# Patient Record
Sex: Female | Born: 1985 | Race: White | Hispanic: No | Marital: Married | Smoking: Never smoker
Health system: Southern US, Community
[De-identification: ages and names within clinical notes are randomized; demographics above are authoritative.]

## PROBLEM LIST (undated history)

## (undated) ENCOUNTER — Inpatient Hospital Stay (HOSPITAL_COMMUNITY): Payer: Self-pay

## (undated) DIAGNOSIS — E282 Polycystic ovarian syndrome: Secondary | ICD-10-CM

## (undated) DIAGNOSIS — R768 Other specified abnormal immunological findings in serum: Secondary | ICD-10-CM

## (undated) DIAGNOSIS — IMO0002 Reserved for concepts with insufficient information to code with codable children: Secondary | ICD-10-CM

## (undated) DIAGNOSIS — R87619 Unspecified abnormal cytological findings in specimens from cervix uteri: Secondary | ICD-10-CM

## (undated) DIAGNOSIS — Z8619 Personal history of other infectious and parasitic diseases: Secondary | ICD-10-CM

## (undated) DIAGNOSIS — D802 Selective deficiency of immunoglobulin A [IgA]: Secondary | ICD-10-CM

## (undated) DIAGNOSIS — R112 Nausea with vomiting, unspecified: Secondary | ICD-10-CM

## (undated) DIAGNOSIS — Z9889 Other specified postprocedural states: Secondary | ICD-10-CM

## (undated) HISTORY — DX: Other specified abnormal immunological findings in serum: R76.8

## (undated) HISTORY — PX: OTHER SURGICAL HISTORY: SHX169

## (undated) HISTORY — PX: MYRINGOTOMY: SUR874

## (undated) HISTORY — DX: Personal history of other infectious and parasitic diseases: Z86.19

## (undated) HISTORY — PX: NO PAST SURGERIES: SHX2092

## (undated) HISTORY — PX: ABDOMINAL HYSTERECTOMY: SHX81

## (undated) HISTORY — PX: ADENOIDECTOMY: SUR15

---
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
## 2005-08-11 ENCOUNTER — Other Ambulatory Visit: Admission: RE | Admit: 2005-08-11 | Discharge: 2005-08-11 | Payer: Self-pay | Admitting: Obstetrics and Gynecology

## 2006-07-15 ENCOUNTER — Encounter: Admission: RE | Admit: 2006-07-15 | Discharge: 2006-07-15 | Payer: Self-pay | Admitting: Gastroenterology

## 2008-07-10 ENCOUNTER — Ambulatory Visit (HOSPITAL_COMMUNITY): Admission: RE | Admit: 2008-07-10 | Discharge: 2008-07-10 | Payer: Self-pay | Admitting: Obstetrics and Gynecology

## 2008-10-04 ENCOUNTER — Inpatient Hospital Stay (HOSPITAL_COMMUNITY): Admission: AD | Admit: 2008-10-04 | Discharge: 2008-10-07 | Payer: Self-pay | Admitting: Obstetrics and Gynecology

## 2010-05-17 ENCOUNTER — Encounter: Payer: Self-pay | Admitting: Gastroenterology

## 2010-06-02 ENCOUNTER — Other Ambulatory Visit: Payer: Self-pay | Admitting: Advanced Practice Midwife

## 2010-06-02 DIAGNOSIS — IMO0002 Reserved for concepts with insufficient information to code with codable children: Secondary | ICD-10-CM

## 2010-06-04 ENCOUNTER — Ambulatory Visit
Admission: RE | Admit: 2010-06-04 | Discharge: 2010-06-04 | Disposition: A | Discharge status: Home / Self Care | Payer: Federal, State, Local not specified - PPO | Source: Ambulatory Visit | Attending: Advanced Practice Midwife | Admitting: Advanced Practice Midwife

## 2010-06-04 DIAGNOSIS — IMO0002 Reserved for concepts with insufficient information to code with codable children: Secondary | ICD-10-CM

## 2010-08-03 LAB — RPR: RPR Ser Ql: NONREACTIVE

## 2010-08-03 LAB — CBC
HCT: 26.2 % — ABNORMAL LOW (ref 36.0–46.0)
Hemoglobin: 12.3 g/dL (ref 12.0–15.0)
Hemoglobin: 9.3 g/dL — ABNORMAL LOW (ref 12.0–15.0)
MCHC: 35.8 g/dL (ref 30.0–36.0)
MCV: 91.7 fL (ref 78.0–100.0)
MCV: 92.3 fL (ref 78.0–100.0)
Platelets: 116 10*3/uL — ABNORMAL LOW (ref 150–400)
RBC: 2.84 MIL/uL — ABNORMAL LOW (ref 3.87–5.11)
RBC: 2.97 MIL/uL — ABNORMAL LOW (ref 3.87–5.11)
RDW: 13.9 % (ref 11.5–15.5)
WBC: 11.1 10*3/uL — ABNORMAL HIGH (ref 4.0–10.5)
WBC: 12.7 10*3/uL — ABNORMAL HIGH (ref 4.0–10.5)

## 2012-02-15 LAB — OB RESULTS CONSOLE ABO/RH: RH Type: POSITIVE

## 2012-02-15 LAB — OB RESULTS CONSOLE GC/CHLAMYDIA: Chlamydia: NEGATIVE

## 2012-02-15 LAB — OB RESULTS CONSOLE RPR: RPR: NONREACTIVE

## 2012-02-15 LAB — OB RESULTS CONSOLE HEPATITIS B SURFACE ANTIGEN: Hepatitis B Surface Ag: NEGATIVE

## 2012-02-15 LAB — OB RESULTS CONSOLE HIV ANTIBODY (ROUTINE TESTING): HIV: NONREACTIVE

## 2012-02-18 DIAGNOSIS — O209 Hemorrhage in early pregnancy, unspecified: Secondary | ICD-10-CM

## 2012-02-25 ENCOUNTER — Inpatient Hospital Stay (HOSPITAL_COMMUNITY)
Admission: AD | Admit: 2012-02-25 | Discharge: 2012-02-25 | Disposition: A | Discharge status: Home / Self Care | Payer: Federal, State, Local not specified - PPO | Source: Ambulatory Visit | Attending: Obstetrics and Gynecology | Admitting: Obstetrics and Gynecology

## 2012-02-25 ENCOUNTER — Encounter (HOSPITAL_COMMUNITY): Payer: Self-pay

## 2012-02-25 ENCOUNTER — Inpatient Hospital Stay (HOSPITAL_COMMUNITY): Payer: Federal, State, Local not specified - PPO

## 2012-02-25 DIAGNOSIS — O209 Hemorrhage in early pregnancy, unspecified: Secondary | ICD-10-CM

## 2012-02-25 HISTORY — DX: Reserved for concepts with insufficient information to code with codable children: IMO0002

## 2012-02-25 HISTORY — DX: Unspecified abnormal cytological findings in specimens from cervix uteri: R87.619

## 2012-02-25 LAB — URINE MICROSCOPIC-ADD ON

## 2012-02-25 LAB — URINALYSIS, ROUTINE W REFLEX MICROSCOPIC
Bilirubin Urine: NEGATIVE
Glucose, UA: NEGATIVE mg/dL
Ketones, ur: 15 mg/dL — AB
Nitrite: NEGATIVE
Specific Gravity, Urine: 1.025 (ref 1.005–1.030)
pH: 6 (ref 5.0–8.0)

## 2012-02-25 NOTE — MAU Note (Signed)
Pt states started spotting around lunchtime. Has had IUP confirmed at [facility] at 7wks. Denies abnormal vaginal discharge. Has had cramping intermittently since beginning of pregnancy.

## 2012-02-25 NOTE — MAU Provider Note (Signed)
  History     CSN: [id number]  Arrival date and time: 02/25/12 1402   First Provider Initiated Contact with Patient 02/25/12 1615      Chief Complaint  Patient presents with  . Vaginal Bleeding   HPI   Pt is [redacted]w[redacted]d pregnant G2P1001 and presents with spotting at lunchtime when she went to the bathroom along with some cramping.  She is not having any spotting or cramping at this time.  She denies pain with urination, or diarrhea. She has had constipation.  Pt had a confirmed IUP at 7 weeks at [facility].  The office is closed this afternoon and was advised to come to MAU for evaluation. She last had intercourse 1 week ago.  Past Medical History  Diagnosis Date  . Abnormal Pap smear     Past Surgical History  Procedure Date  . No past surgeries     Family History  Problem Relation Age of Onset  . Other Neg Hx     History  Substance Use Topics  . Smoking status: Never Smoker   . Smokeless tobacco: Never Used  . Alcohol Use: No    Allergies: No Known Allergies  Prescriptions prior to admission  Medication Sig Dispense Refill  . Aspirin-Acetaminophen-Caffeine (EXCEDRIN PO) Take 1 tablet by mouth daily as needed. Pain      . ondansetron (ZOFRAN-ODT) 8 MG disintegrating tablet Take 8 mg by mouth every 8 (eight) hours as needed. nausea      . Prenatal Vit-Fe Fumarate-FA (PRENATAL MULTIVITAMIN) TABS Take 1 tablet by mouth at bedtime.        Review of Systems  Constitutional: Negative for fever and chills.  Gastrointestinal: Positive for abdominal pain and constipation. Negative for diarrhea.  Genitourinary: Negative for dysuria and urgency.   Physical Exam   Blood pressure 113/76, pulse 108, temperature 98.7 F (37.1 C), temperature source Oral, resp. rate 16, height 5\' 4"  (1.626 m), weight 105.235 kg (232 lb).  Physical Exam  Vitals reviewed. Constitutional: She is oriented to person, place, and time. She appears well-developed and well-nourished.  HENT:    Head: Normocephalic.  Eyes: Pupils are equal, round, and reactive to light.  Neck: Normal range of motion. Neck supple.  Cardiovascular: Normal rate.   Respiratory: Effort normal.  GI: Soft. She exhibits no distension. There is no tenderness. There is no rebound and no guarding.  Genitourinary:       Cervix NTclosed; no bleeding noted; small amount of creamy white discharge in vault; uterus gravid nontender  Musculoskeletal: Normal range of motion.  Neurological: She is alert and oriented to person, place, and time.  Skin: Skin is warm and dry.  Psychiatric: She has a normal mood and affect.    MAU Course  Procedures  Dr. Henderson Cloud aware Urinalysis pending- will contact pt if needs treatment No results found for this or any previous visit (from the past 24 hour(s)). Assessment and Plan  Bleeding in pregnancy- unknown reason Viable IUP [redacted]w[redacted]d by LMP;  [redacted]w[redacted]d by CRL D/c home on pelvic rest F/u with OB appointment- f/u sooner if sx recur Naomy Esham 02/25/2012, 4:19 PM

## 2012-02-26 LAB — URINE CULTURE

## 2012-04-26 NOTE — L&D Delivery Note (Signed)
Delivery Note  SVD viable female Apgars 8,9 over 2nd degree ML lac.  Placenta delivered spontaneously intact with 3VC. Repair with 2-0 Chromic with good support and hemostasis noted and R/V exam confirms.  PH art was sent.  [place] cord blood was not done.  Mother and baby were doing well.  EBL 350cc  Candice Camp, MD

## 2012-09-13 ENCOUNTER — Encounter (HOSPITAL_COMMUNITY): Payer: Self-pay | Admitting: *Deleted

## 2012-09-13 ENCOUNTER — Inpatient Hospital Stay (HOSPITAL_COMMUNITY)
Admission: AD | Admit: 2012-09-13 | Discharge: 2012-09-13 | Disposition: A | Discharge status: Home / Self Care | Payer: Federal, State, Local not specified - PPO | Source: Ambulatory Visit | Attending: Obstetrics and Gynecology | Admitting: Obstetrics and Gynecology

## 2012-09-13 ENCOUNTER — Telehealth (HOSPITAL_COMMUNITY): Payer: Self-pay | Admitting: *Deleted

## 2012-09-13 DIAGNOSIS — O26859 Spotting complicating pregnancy, unspecified trimester: Secondary | ICD-10-CM | POA: Insufficient documentation

## 2012-09-13 DIAGNOSIS — R197 Diarrhea, unspecified: Secondary | ICD-10-CM | POA: Insufficient documentation

## 2012-09-13 DIAGNOSIS — I2510 Atherosclerotic heart disease of native coronary artery without angina pectoris: Secondary | ICD-10-CM | POA: Insufficient documentation

## 2012-09-13 LAB — CBC WITH DIFFERENTIAL/PLATELET
Eosinophils Absolute: 0 10*3/uL (ref 0.0–0.7)
Eosinophils Relative: 0 % (ref 0–5)
HCT: 33.6 % — ABNORMAL LOW (ref 36.0–46.0)
Lymphocytes Relative: 13 % (ref 12–46)
Lymphs Abs: 2 10*3/uL (ref 0.7–4.0)
MCH: 30.1 pg (ref 26.0–34.0)
MCV: 88 fL (ref 78.0–100.0)
Monocytes Absolute: 1.1 10*3/uL — ABNORMAL HIGH (ref 0.1–1.0)
Platelets: 158 10*3/uL (ref 150–400)
RBC: 3.82 MIL/uL — ABNORMAL LOW (ref 3.87–5.11)
RDW: 14.6 % (ref 11.5–15.5)
WBC: 15.9 10*3/uL — ABNORMAL HIGH (ref 4.0–10.5)

## 2012-09-13 NOTE — Progress Notes (Signed)
Pt admits to nausea this morning and diarrhea this morning

## 2012-09-13 NOTE — Telephone Encounter (Signed)
Preadmission screen  

## 2012-09-13 NOTE — Progress Notes (Signed)
Dr Marcelle Overlie sent pt over for evaluation of ROM. Pt admits to some leakage of fluid and some spotting yesterday

## 2012-09-13 NOTE — MAU Note (Signed)
Pt was seen in the doctors office and FHR was rapid. Pt sent over for evaluation of fetal heart rate

## 2012-09-13 NOTE — MAU Provider Note (Signed)
Kelsey Gonzales is 27 y.o. G2P1001 [redacted]w[redacted]d weeks presenting with orders from the office for CBC and amniosure.  She was seen by Dr. Marcelle Overlie.  Had ultrasound to measure fluid in the office,  told the baby's heart rate was fast on that exam.   Had membranes stripped yesterday and saw spotting then.  None today.  + fetal movement.  She questions if her membranes are leaking.  Fern in office was negative.  Amniosure has been collected.  Cervical dilatation yesterday in the office 3cm.  She is comfortable at this time.  Result of amniosure will be reported to Dr. Marcelle Overlie by Massie Bougie, RN

## 2012-09-14 ENCOUNTER — Encounter (HOSPITAL_COMMUNITY): Payer: Self-pay | Admitting: *Deleted

## 2012-09-14 ENCOUNTER — Inpatient Hospital Stay (HOSPITAL_COMMUNITY): Payer: Federal, State, Local not specified - PPO | Admitting: Anesthesiology

## 2012-09-14 ENCOUNTER — Encounter (HOSPITAL_COMMUNITY): Payer: Self-pay | Admitting: Anesthesiology

## 2012-09-14 ENCOUNTER — Inpatient Hospital Stay (HOSPITAL_COMMUNITY)
Admission: AD | Admit: 2012-09-14 | Discharge: 2012-09-15 | DRG: 373 | Disposition: A | Discharge status: Home / Self Care | Payer: Federal, State, Local not specified - PPO | Source: Ambulatory Visit | Attending: Obstetrics and Gynecology | Admitting: Obstetrics and Gynecology

## 2012-09-14 LAB — CBC
Hemoglobin: 11.8 g/dL — ABNORMAL LOW (ref 12.0–15.0)
MCH: 30.2 pg (ref 26.0–34.0)
MCV: 87.2 fL (ref 78.0–100.0)
Platelets: 147 10*3/uL — ABNORMAL LOW (ref 150–400)
RBC: 3.91 MIL/uL (ref 3.87–5.11)
WBC: 20.2 10*3/uL — ABNORMAL HIGH (ref 4.0–10.5)

## 2012-09-14 MED ORDER — WITCH HAZEL-GLYCERIN EX PADS: 1.0000 "application " | MEDICATED_PAD | CUTANEOUS | Status: DC | PRN

## 2012-09-14 MED ORDER — PRENATAL MULTIVITAMIN CH
1.0000 | ORAL_TABLET | Freq: Every day | ORAL | Status: DC
  Filled 2012-09-14: qty 1

## 2012-09-14 MED ORDER — SENNOSIDES-DOCUSATE SODIUM 8.6-50 MG PO TABS
2.0000 | ORAL_TABLET | Freq: Every day | ORAL | Status: DC
  Administered 2012-09-14: 2 via ORAL

## 2012-09-14 MED ORDER — BENZOCAINE-MENTHOL 20-0.5 % EX AERO
1.0000 "application " | INHALATION_SPRAY | CUTANEOUS | Status: DC | PRN
  Filled 2012-09-14: qty 56

## 2012-09-14 MED ORDER — OXYTOCIN 40 UNITS IN LACTATED RINGERS INFUSION - SIMPLE MED
62.5000 mL/h | INTRAVENOUS | Status: DC
  Filled 2012-09-14: qty 1000

## 2012-09-14 MED ORDER — DIPHENHYDRAMINE HCL 25 MG PO CAPS: 25.0000 mg | ORAL_CAPSULE | Freq: Four times a day (QID) | ORAL | Status: DC | PRN

## 2012-09-14 MED ORDER — IBUPROFEN 600 MG PO TABS
600.0000 mg | ORAL_TABLET | Freq: Four times a day (QID) | ORAL | Status: DC
Start: 2012-09-14 — End: 2012-09-15
  Administered 2012-09-14 – 2012-09-15 (×4): 600 mg via ORAL
  Filled 2012-09-14 (×4): qty 1

## 2012-09-14 MED ORDER — FENTANYL 2.5 MCG/ML BUPIVACAINE 1/10 % EPIDURAL INFUSION (WH - ANES)
14.0000 mL/h | INTRAMUSCULAR | Status: DC | PRN
  Administered 2012-09-14: 14 mL/h via EPIDURAL
  Filled 2012-09-14: qty 125

## 2012-09-14 MED ORDER — ZOLPIDEM TARTRATE 5 MG PO TABS: 5.0000 mg | ORAL_TABLET | Freq: Every evening | ORAL | Status: DC | PRN

## 2012-09-14 MED ORDER — DIPHENHYDRAMINE HCL 50 MG/ML IJ SOLN: 12.5000 mg | INTRAMUSCULAR | Status: DC | PRN

## 2012-09-14 MED ORDER — MEASLES, MUMPS & RUBELLA VAC ~~LOC~~ INJ
0.5000 mL | INJECTION | Freq: Once | SUBCUTANEOUS | Status: DC
  Filled 2012-09-14: qty 0.5

## 2012-09-14 MED ORDER — IBUPROFEN 600 MG PO TABS: 600.0000 mg | ORAL_TABLET | Freq: Four times a day (QID) | ORAL | Status: DC | PRN

## 2012-09-14 MED ORDER — LIDOCAINE HCL (PF) 1 % IJ SOLN
30.0000 mL | INTRAMUSCULAR | Status: DC | PRN
  Filled 2012-09-14 (×2): qty 30

## 2012-09-14 MED ORDER — ONDANSETRON HCL 4 MG/2ML IJ SOLN
4.0000 mg | Freq: Four times a day (QID) | INTRAMUSCULAR | Status: DC | PRN
  Administered 2012-09-14: 4 mg via INTRAVENOUS
  Filled 2012-09-14: qty 2

## 2012-09-14 MED ORDER — MEDROXYPROGESTERONE ACETATE 150 MG/ML IM SUSP: 150.0000 mg | INTRAMUSCULAR | Status: DC | PRN

## 2012-09-14 MED ORDER — OXYCODONE-ACETAMINOPHEN 5-325 MG PO TABS
1.0000 | ORAL_TABLET | ORAL | Status: DC | PRN
  Administered 2012-09-14: 1 via ORAL
  Filled 2012-09-14: qty 1

## 2012-09-14 MED ORDER — LANOLIN HYDROUS EX OINT: TOPICAL_OINTMENT | CUTANEOUS | Status: DC | PRN

## 2012-09-14 MED ORDER — ONDANSETRON HCL 4 MG PO TABS: 4.0000 mg | ORAL_TABLET | ORAL | Status: DC | PRN

## 2012-09-14 MED ORDER — CITRIC ACID-SODIUM CITRATE 334-500 MG/5ML PO SOLN: 30.0000 mL | ORAL | Status: DC | PRN

## 2012-09-14 MED ORDER — PHENYLEPHRINE 40 MCG/ML (10ML) SYRINGE FOR IV PUSH (FOR BLOOD PRESSURE SUPPORT)
80.0000 ug | PREFILLED_SYRINGE | INTRAVENOUS | Status: DC | PRN
  Filled 2012-09-14: qty 2

## 2012-09-14 MED ORDER — PHENYLEPHRINE 40 MCG/ML (10ML) SYRINGE FOR IV PUSH (FOR BLOOD PRESSURE SUPPORT)
80.0000 ug | PREFILLED_SYRINGE | INTRAVENOUS | Status: DC | PRN
  Filled 2012-09-14: qty 5
  Filled 2012-09-14: qty 2

## 2012-09-14 MED ORDER — TETANUS-DIPHTH-ACELL PERTUSSIS 5-2.5-18.5 LF-MCG/0.5 IM SUSP: 0.5000 mL | Freq: Once | INTRAMUSCULAR | Status: DC

## 2012-09-14 MED ORDER — PRENATAL MULTIVITAMIN CH: 1.0000 | ORAL_TABLET | Freq: Every day | ORAL | Status: DC

## 2012-09-14 MED ORDER — SIMETHICONE 80 MG PO CHEW: 80.0000 mg | CHEWABLE_TABLET | ORAL | Status: DC | PRN

## 2012-09-14 MED ORDER — DIBUCAINE 1 % RE OINT
1.0000 "application " | TOPICAL_OINTMENT | RECTAL | Status: DC | PRN
  Filled 2012-09-14: qty 28

## 2012-09-14 MED ORDER — OXYTOCIN BOLUS FROM INFUSION
500.0000 mL | INTRAVENOUS | Status: DC
  Administered 2012-09-14: 500 mL via INTRAVENOUS

## 2012-09-14 MED ORDER — OXYCODONE-ACETAMINOPHEN 5-325 MG PO TABS
1.0000 | ORAL_TABLET | ORAL | Status: DC | PRN
Start: 2012-09-14 — End: 2012-09-14

## 2012-09-14 MED ORDER — ACETAMINOPHEN 325 MG PO TABS: 650.0000 mg | ORAL_TABLET | ORAL | Status: DC | PRN

## 2012-09-14 MED ORDER — LACTATED RINGERS IV SOLN
500.0000 mL | Freq: Once | INTRAVENOUS | Status: AC
  Administered 2012-09-14: 500 mL via INTRAVENOUS

## 2012-09-14 MED ORDER — FLEET ENEMA 7-19 GM/118ML RE ENEM: 1.0000 | ENEMA | RECTAL | Status: DC | PRN

## 2012-09-14 MED ORDER — SODIUM BICARBONATE 8.4 % IV SOLN
INTRAVENOUS | Status: DC | PRN
  Administered 2012-09-14: 5 mL via EPIDURAL

## 2012-09-14 MED ORDER — EPHEDRINE 5 MG/ML INJ
10.0000 mg | INTRAVENOUS | Status: DC | PRN
  Filled 2012-09-14: qty 2
  Filled 2012-09-14: qty 4

## 2012-09-14 MED ORDER — LACTATED RINGERS IV SOLN: 500.0000 mL | INTRAVENOUS | Status: DC | PRN

## 2012-09-14 MED ORDER — EPHEDRINE 5 MG/ML INJ
10.0000 mg | INTRAVENOUS | Status: DC | PRN
  Filled 2012-09-14: qty 2

## 2012-09-14 MED ORDER — LACTATED RINGERS IV SOLN: INTRAVENOUS | Status: DC

## 2012-09-14 MED ORDER — ONDANSETRON HCL 4 MG/2ML IJ SOLN: 4.0000 mg | INTRAMUSCULAR | Status: DC | PRN

## 2012-09-14 NOTE — H&P (Signed)
Kelsey Gonzales is a 27 y.o. female presenting for srom and now in labor.  Pregnancy uncomplicated. History OB History   Grav Para Term Preterm Abortions TAB SAB Ect Mult Living   2 1 1       1      Past Medical History  Diagnosis Date  . Abnormal Pap smear   . Hx of varicella    Past Surgical History  Procedure Laterality Date  . No past surgeries    . Myringotomy     Family History: family history includes Cancer in her maternal grandmother and paternal grandfather; Depression in her father, maternal grandfather, maternal grandmother, and paternal grandmother; Heart disease in her maternal grandfather, maternal grandmother, paternal grandfather, and paternal grandmother; Hypertension in her father, maternal grandfather, maternal grandmother, paternal grandfather, and paternal grandmother; Kidney Stones in her father; Migraines in her father; and Muscular dystrophy in her father and paternal grandmother.  There is no history of Other. Social History:  reports that she has never smoked. She has never used smokeless tobacco. She reports that she does not drink alcohol or use illicit drugs.   Prenatal Transfer Tool  Maternal Diabetes: No Genetic Screening: Normal Maternal Ultrasounds/Referrals: Normal Fetal Ultrasounds or other Referrals:  None Maternal Substance Abuse:  No Significant Maternal Medications:  None Significant Maternal Lab Results:  None Other Comments:     ROS  Dilation: Lip/rim Effacement (%): 100 Station: 0 Exam by:: Luan Moore, RN Blood pressure 85/57, pulse 119, temperature 98.9 F (37.2 C), temperature source Oral, resp. rate 20, height 5\' 5"  (1.651 m), weight 114.023 kg (251 lb 6 oz), SpO2 100.00%. Exam Physical Exam  Prenatal labs: ABO, Rh: A/Positive/-- (10/22 0000) Antibody: Negative (10/22 0000) Rubella: Immune (10/22 0000) RPR: Nonreactive (10/22 0000)  HBsAg: Negative (10/22 0000)  HIV: Non-reactive (10/22 0000)  GBS: Negative (05/01 0000)    Assessment/Plan: IUP at term in active labor Anticipate SVD   Chrisandra Wiemers C 09/14/2012, 8:40 AM

## 2012-09-14 NOTE — MAU Note (Signed)
PT SAYS SHE WAS HERE YESTERDAY- VE 3 CM- UNSURE IF SROM- AMINSURE- NEG.    HAS BEEN HAVING UC ALL NIGHT - SINCE 0230- BAD.   STILL LEAKING SOMETHING.  DENIES HSV AND MRSA.

## 2012-09-14 NOTE — Anesthesia Postprocedure Evaluation (Signed)
Anesthesia Post Note  Patient: Kelsey Gonzales  Procedure(s) Performed: * No procedures listed *  Anesthesia type: Epidural  Patient location: Mother/Baby  Post pain: Pain level controlled  Post assessment: Post-op Vital signs reviewed  Last Vitals:  Filed Vitals:   09/14/12 1300  BP: 130/61  Pulse: 99  Temp: 37.1 C  Resp: 18    Post vital signs: Reviewed  Level of consciousness:alert  Complications: No apparent anesthesia complications

## 2012-09-14 NOTE — Anesthesia Preprocedure Evaluation (Signed)
Anesthesia Evaluation  Patient identified by MRN, date of birth, ID band Patient awake    Reviewed: Allergy & Precautions, H&P , Patient's Chart, lab work & pertinent test results  Airway Mallampati: II TM Distance: >3 FB Neck ROM: full    Dental no notable dental hx.    Pulmonary  breath sounds clear to auscultation  Pulmonary exam normal       Cardiovascular Exercise Tolerance: Good Rhythm:regular Rate:Normal     Neuro/Psych    GI/Hepatic   Endo/Other    Renal/GU      Musculoskeletal   Abdominal   Peds  Hematology   Anesthesia Other Findings   Reproductive/Obstetrics                           Anesthesia Physical Anesthesia Plan  ASA: III  Anesthesia Plan: Epidural   Post-op Pain Management:    Induction:   Airway Management Planned:   Additional Equipment:   Intra-op Plan:   Post-operative Plan:   Informed Consent: I have reviewed the patients History and Physical, chart, labs and discussed the procedure including the risks, benefits and alternatives for the proposed anesthesia with the patient or authorized representative who has indicated his/her understanding and acceptance.   Dental Advisory Given  Plan Discussed with:   Anesthesia Plan Comments: (Labs checked- platelets confirmed with RN in room. Fetal heart tracing, per RN, reported to be stable enough for sitting procedure. Discussed epidural, and patient consents to the procedure:  included risk of possible headache,backache, failed block, allergic reaction, and nerve injury. This patient was asked if she had any questions or concerns before the procedure started. )        Anesthesia Quick Evaluation

## 2012-09-14 NOTE — MAU Note (Signed)
PT IN B-ROOM 

## 2012-09-14 NOTE — Anesthesia Procedure Notes (Signed)

## 2012-09-15 LAB — CBC
HCT: 30.5 % — ABNORMAL LOW (ref 36.0–46.0)
Hemoglobin: 10.3 g/dL — ABNORMAL LOW (ref 12.0–15.0)
MCH: 30.4 pg (ref 26.0–34.0)
MCHC: 33.8 g/dL (ref 30.0–36.0)
MCV: 90 fL (ref 78.0–100.0)

## 2012-09-15 MED ORDER — IBUPROFEN 600 MG PO TABS: 600.0000 mg | ORAL_TABLET | Freq: Four times a day (QID) | ORAL | Status: DC

## 2012-09-15 NOTE — Discharge Summary (Signed)
Obstetric Discharge Summary Reason for Admission: onset of labor and rupture of membranes Prenatal Procedures: ultrasound Intrapartum Procedures: spontaneous vaginal delivery Postpartum Procedures: none Complications-Operative and Postpartum: 2 degree perineal laceration Hemoglobin  Date Value Range Status  09/15/2012 10.3* 12.0 - 15.0 g/dL Final     HCT  Date Value Range Status  09/15/2012 30.5* 36.0 - 46.0 % Final    Physical Exam:  General: alert and cooperative Lochia: appropriate Uterine Fundus: firm Incision: perineum intact DVT Evaluation: No evidence of DVT seen on physical exam. Negative Homan's sign. No cords or calf tenderness. No significant calf/ankle edema.  Discharge Diagnoses: Term Pregnancy-delivered  Discharge Information: Date: 09/15/2012 Activity: pelvic rest Diet: routine Medications: PNV and Ibuprofen Condition: stable Instructions: refer to practice specific booklet Discharge to: home   Newborn Data: Live born female  Birth Weight: 7 lb 12.2 oz (3521 g) APGAR: 8, 9  Home with mother.  Jagjit Riner G 09/15/2012, 8:22 AM

## 2012-09-15 NOTE — Progress Notes (Cosign Needed)
Post Partum Day 1 Subjective: no complaints, up ad lib, voiding and tolerating PO  Objective: Blood pressure 101/67, pulse 80, temperature 98.1 F (36.7 C), temperature source Oral, resp. rate 18, height 5\' 5"  (1.651 m), weight 251 lb 6 oz (114.023 kg), SpO2 100.00%, unknown if currently breastfeeding.  Physical Exam:  General: alert and cooperative Lochia: appropriate Uterine Fundus: firm Incision: perineum intact DVT Evaluation: No evidence of DVT seen on physical exam. Negative Homan's sign. No cords or calf tenderness. No significant calf/ankle edema.   Recent Labs  09/14/12 0730 09/15/12 0620  HGB 11.8* 10.3*  HCT 34.1* 30.5*    Assessment/Plan: Discharge home   LOS: 1 day   Guynell Kleiber G 09/15/2012, 8:16 AM

## 2012-09-20 ENCOUNTER — Inpatient Hospital Stay (HOSPITAL_COMMUNITY): Admission: RE | Admit: 2012-09-20 | Payer: Federal, State, Local not specified - PPO | Source: Ambulatory Visit

## 2012-09-21 ENCOUNTER — Emergency Department (HOSPITAL_COMMUNITY): Payer: Federal, State, Local not specified - PPO

## 2012-09-21 ENCOUNTER — Emergency Department (HOSPITAL_COMMUNITY)
Admission: EM | Admit: 2012-09-21 | Discharge: 2012-09-21 | Disposition: A | Discharge status: Home / Self Care | Payer: Federal, State, Local not specified - PPO | Attending: Emergency Medicine | Admitting: Emergency Medicine

## 2012-09-21 ENCOUNTER — Encounter (HOSPITAL_COMMUNITY): Payer: Self-pay | Admitting: Emergency Medicine

## 2012-09-21 DIAGNOSIS — X500XXA Overexertion from strenuous movement or load, initial encounter: Secondary | ICD-10-CM | POA: Insufficient documentation

## 2012-09-21 DIAGNOSIS — Y9389 Activity, other specified: Secondary | ICD-10-CM | POA: Insufficient documentation

## 2012-09-21 DIAGNOSIS — M25561 Pain in right knee: Secondary | ICD-10-CM

## 2012-09-21 DIAGNOSIS — IMO0002 Reserved for concepts with insufficient information to code with codable children: Secondary | ICD-10-CM | POA: Insufficient documentation

## 2012-09-21 DIAGNOSIS — Z8619 Personal history of other infectious and parasitic diseases: Secondary | ICD-10-CM | POA: Insufficient documentation

## 2012-09-21 DIAGNOSIS — Z79899 Other long term (current) drug therapy: Secondary | ICD-10-CM | POA: Insufficient documentation

## 2012-09-21 DIAGNOSIS — S99919A Unspecified injury of unspecified ankle, initial encounter: Secondary | ICD-10-CM | POA: Insufficient documentation

## 2012-09-21 DIAGNOSIS — Y9289 Other specified places as the place of occurrence of the external cause: Secondary | ICD-10-CM | POA: Insufficient documentation

## 2012-09-21 DIAGNOSIS — S8990XA Unspecified injury of unspecified lower leg, initial encounter: Secondary | ICD-10-CM | POA: Insufficient documentation

## 2012-09-21 MED ORDER — ACETAMINOPHEN 500 MG PO TABS
1000.0000 mg | ORAL_TABLET | Freq: Once | ORAL | Status: AC
  Administered 2012-09-21: 1000 mg via ORAL
  Filled 2012-09-21: qty 2

## 2012-09-21 MED ORDER — IBUPROFEN 400 MG PO TABS
600.0000 mg | ORAL_TABLET | Freq: Once | ORAL | Status: AC
  Administered 2012-09-21: 600 mg via ORAL
  Filled 2012-09-21: qty 1

## 2012-09-21 NOTE — ED Provider Notes (Signed)
Medical screening examination/treatment/procedure(s) were performed by non-physician practitioner and as supervising physician I was immediately available for consultation/collaboration.Gwyneth Sprout, MD 09/21/12 2029

## 2012-09-21 NOTE — Progress Notes (Signed)
Orthopedic Tech Progress Note Patient Details:  Kelsey Gonzales 08/20/1985 [id number]  Ortho Devices Type of Ortho Device: Crutches;Knee Immobilizer Ortho Device/Splint Location: RLE Ortho Device/Splint Interventions: Ordered;Application;Adjustment   Jennye Moccasin 09/21/2012, 7:42 PM

## 2012-09-21 NOTE — ED Notes (Signed)
Pt here with knee pain. Pt's knee gave way while ambulating in kitchen.

## 2012-09-21 NOTE — ED Provider Notes (Signed)
History     CSN: [id number]  Arrival date & time 09/21/12  1650   First MD Initiated Contact with Patient 09/21/12 1656      Chief Complaint  Patient presents with  . Knee Pain    (Consider location/radiation/quality/duration/timing/severity/associated sxs/prior treatment) HPI Comments: Patient is a 27 year old female who presents today after twisting her knee while holding her daughter a few hours ago. She is having a sharp pains and the sensation that her knee is locked. Straightening her knee makes the pain worse. No prior injury to this knee. No medications PTA. She has been icing her knee. She is currently breast feeding her daughter. No fevers, chills, abdominal pain.   Patient is a 27 y.o. female presenting with knee pain. The history is provided by the patient. No language interpreter was used.  Knee Pain Associated symptoms: no fever     Past Medical History  Diagnosis Date  . Abnormal Pap smear   . Hx of varicella     Past Surgical History  Procedure Laterality Date  . No past surgeries    . Myringotomy      Family History  Problem Relation Age of Onset  . Other Neg Hx   . Hypertension Father   . Kidney Stones Father   . Migraines Father   . Depression Father   . Muscular dystrophy Father   . Heart disease Maternal Grandmother   . Hypertension Maternal Grandmother   . Cancer Maternal Grandmother     kidney  . Depression Maternal Grandmother   . Heart disease Maternal Grandfather   . Hypertension Maternal Grandfather   . Depression Maternal Grandfather   . Heart disease Paternal Grandmother   . Hypertension Paternal Grandmother   . Depression Paternal Grandmother   . Muscular dystrophy Paternal Grandmother   . Heart disease Paternal Grandfather   . Hypertension Paternal Grandfather   . Cancer Paternal Grandfather     colon, leaukemia    History  Substance Use Topics  . Smoking status: Never Smoker   . Smokeless tobacco: Never Used  . Alcohol  Use: No    OB History   Grav Para Term Preterm Abortions TAB SAB Ect Mult Living   2 2 2       2       Review of Systems  Constitutional: Negative for fever and chills.  Respiratory: Negative for shortness of breath.   Cardiovascular: Negative for chest pain.  Gastrointestinal: Negative for nausea, vomiting and abdominal pain.  Musculoskeletal: Positive for joint swelling, arthralgias and gait problem.  All other systems reviewed and are negative.    Allergies  Review of patient's allergies indicates no known allergies.  Home Medications   Current Outpatient Rx  Name  Route  Sig  Dispense  Refill  . acetaminophen (TYLENOL) 500 MG tablet   Oral   Take 500 mg by mouth every 6 (six) hours as needed for pain (headache).         . hydrocortisone cream 1 %   Topical   Apply topically 2 (two) times daily.         Marland Kitchen ibuprofen (ADVIL,MOTRIN) 600 MG tablet   Oral   Take 1 tablet (600 mg total) by mouth every 6 (six) hours.   30 tablet   1   . Prenatal Vit-Fe Fumarate-FA (PRENATAL MULTIVITAMIN) TABS   Oral   Take 1 tablet by mouth at bedtime.           BP 114/70  Pulse 100  Resp 18  SpO2 100%  Physical Exam  Nursing note and vitals reviewed. Constitutional: She is oriented to person, place, and time. She appears well-developed and well-nourished. No distress.  HENT:  Head: Normocephalic and atraumatic.  Right Ear: External ear normal.  Left Ear: External ear normal.  Nose: Nose normal.  Mouth/Throat: Oropharynx is clear and moist.  Eyes: Conjunctivae are normal.  Neck: Normal range of motion.  Cardiovascular: Normal rate, regular rhythm and normal heart sounds.   Pulmonary/Chest: Effort normal and breath sounds normal. No stridor. No respiratory distress. She has no wheezes. She has no rales.  Abdominal: Soft. She exhibits no distension.  Musculoskeletal:       Right knee: She exhibits decreased range of motion and swelling. She exhibits no ecchymosis, no  deformity, no erythema, no LCL laxity, normal patellar mobility and no MCL laxity. Tenderness found. Medial joint line tenderness noted. No lateral joint line, no MCL, no LCL and no patellar tendon tenderness noted.  Neurological: She is alert and oriented to person, place, and time. She has normal strength.  Skin: Skin is warm and dry. She is not diaphoretic. No erythema.  Psychiatric: She has a normal mood and affect. Her behavior is normal.    ED Course  Procedures (including critical care time)  Labs Reviewed - No data to display Dg Knee Complete 4 Views Right  09/21/2012   *RADIOLOGY REPORT*  Clinical Data: Fall.  Knee injury and pain.  RIGHT KNEE - COMPLETE 4+ VIEW  Comparison:  None.  Findings:  There is no evidence of fracture, dislocation, or joint effusion.  There is no evidence of arthropathy or other focal bone abnormality.  Soft tissues are unremarkable.  IMPRESSION: Negative.   Original Report Authenticated By: Myles Rosenthal, M.D.    [phone]: Patient still waiting for xray. Tylenol did not help. Orders put in for ice pack and advil.   1. Knee pain, acute, right       MDM  Patient presents today after her knee gave out earlier today. XR negative for acute pathology. Given a knee immobilizer and crutches. Advil and tylenol for pain since she is breast feeding. Ice the area. Ortho referral given. Return instruction discussed. Vital signs stable for discharge. Patient / Family / Caregiver informed of clinical course, understand medical decision-making process, and agree with plan.        Mora Bellman, PA-C 09/21/12 [id number]

## 2013-03-01 ENCOUNTER — Other Ambulatory Visit: Payer: Self-pay

## 2013-05-30 ENCOUNTER — Encounter: Payer: Self-pay | Admitting: Gastroenterology

## 2013-07-02 ENCOUNTER — Ambulatory Visit: Payer: Federal, State, Local not specified - PPO | Admitting: Gastroenterology

## 2013-08-10 ENCOUNTER — Other Ambulatory Visit (INDEPENDENT_AMBULATORY_CARE_PROVIDER_SITE_OTHER): Payer: Federal, State, Local not specified - PPO

## 2013-08-10 ENCOUNTER — Ambulatory Visit (INDEPENDENT_AMBULATORY_CARE_PROVIDER_SITE_OTHER): Payer: Federal, State, Local not specified - PPO | Admitting: Gastroenterology

## 2013-08-10 ENCOUNTER — Encounter: Payer: Self-pay | Admitting: Gastroenterology

## 2013-08-10 VITALS — BP 122/80 | HR 72 | Ht 64.0 in | Wt 240.2 lb

## 2013-08-10 DIAGNOSIS — R197 Diarrhea, unspecified: Secondary | ICD-10-CM

## 2013-08-10 LAB — COMPREHENSIVE METABOLIC PANEL
ALK PHOS: 63 U/L (ref 39–117)
ALT: 19 U/L (ref 0–35)
AST: 16 U/L (ref 0–37)
Albumin: 4.1 g/dL (ref 3.5–5.2)
BILIRUBIN TOTAL: 0.6 mg/dL (ref 0.3–1.2)
BUN: 11 mg/dL (ref 6–23)
CO2: 26 mEq/L (ref 19–32)
Calcium: 9.6 mg/dL (ref 8.4–10.5)
Chloride: 103 mEq/L (ref 96–112)
Creatinine, Ser: 0.7 mg/dL (ref 0.4–1.2)
GFR: 109.31 mL/min (ref >60.00)
GLUCOSE: 102 mg/dL — AB (ref 70–99)
POTASSIUM: 4 meq/L (ref 3.5–5.1)
SODIUM: 137 meq/L (ref 135–145)
TOTAL PROTEIN: 7.7 g/dL (ref 6.0–8.3)

## 2013-08-10 LAB — CBC WITH DIFFERENTIAL/PLATELET
Basophils Absolute: 0.1 10*3/uL (ref 0.0–0.1)
Basophils Relative: 0.8 % (ref 0.0–3.0)
EOS ABS: 0.2 10*3/uL (ref 0.0–0.7)
EOS PCT: 1.7 % (ref 0.0–5.0)
HEMATOCRIT: 38.9 % (ref 36.0–46.0)
Hemoglobin: 13.2 g/dL (ref 12.0–15.0)
LYMPHS ABS: 2.4 10*3/uL (ref 0.7–4.0)
Lymphocytes Relative: 26 % (ref 12.0–46.0)
MCHC: 34 g/dL (ref 30.0–36.0)
MCV: 82.9 fl (ref 78.0–100.0)
MONO ABS: 0.6 10*3/uL (ref 0.1–1.0)
Monocytes Relative: 5.9 % (ref 3.0–12.0)
Neutro Abs: 6.1 10*3/uL (ref 1.4–7.7)
Neutrophils Relative %: 65.6 % (ref 43.0–77.0)
PLATELETS: 180 10*3/uL (ref 150.0–400.0)
RBC: 4.7 Mil/uL (ref 3.87–5.11)
RDW: 14.1 % (ref 11.5–14.6)
WBC: 9.4 10*3/uL (ref 4.5–10.5)

## 2013-08-10 MED ORDER — PEG-KCL-NACL-NASULF-NA ASC-C 100 G PO SOLR: 1.0000 | Freq: Once | ORAL | Status: DC

## 2013-08-10 MED ORDER — HYOSCYAMINE SULFATE ER 0.375 MG PO TB12: 0.3750 mg | ORAL_TABLET | Freq: Two times a day (BID) | ORAL | Status: DC

## 2013-08-10 NOTE — Patient Instructions (Addendum)
You will have labs checked today in the basement lab.  Please head down after you check out with the front desk  (celiac sprue panel, stool for routine culture, stool for giardia, cmet, cbc, tsh). Stop all caffeine products for now. You will be set up for a colonoscopy for diarrhea. Twice daily antispasm med called into [facility].

## 2013-08-10 NOTE — Progress Notes (Signed)
HPI: This is a   very pleasant 28 year old woman who is here with her 28-year-old daughter today.  Has diarrhea usually after certain foods (bread, pasta, dairy at times, coffee, choc chip cookies).  She cut out all those foods, she had no abd cramping, no diarhea).  Bread can be the worst for her.  She has seen rectal bleeding, rarely, with hemorrhoids and constipation.  Bloating is a problem for her.  Nausea.    Very rarely constipation.  Delivered baby 10 months ago.  Since then she is up 12 pounds.  She normally has watery diarrhea, for about a day.  She doesn't eat yogurt and only rarely.  Ice cream causes problem.  Drinks 1-2 sweet teas per day, Dr. Reino KentPepper 2-3 times per week.    Rare other sodas, no sports drinks.  Never tested for celiac sprue.  This has been going on for many years.  Takes 2-3 imodium per month.    Review of systems: Pertinent positive and negative review of systems were noted in the above HPI section. Complete review of systems was performed and was otherwise normal.    Past Medical History  Diagnosis Date  . Abnormal Pap smear   . Hx of varicella     Past Surgical History  Procedure Laterality Date  . No past surgeries    . Myringotomy      Current Outpatient Prescriptions  Medication Sig Dispense Refill  . ibuprofen (ADVIL,MOTRIN) 600 MG tablet Take 1 tablet (600 mg total) by mouth every 6 (six) hours.  30 tablet  1  . Prenatal Vit-Fe Fumarate-FA (PRENATAL MULTIVITAMIN) TABS Take 1 tablet by mouth at bedtime.       No current facility-administered medications for this visit.    Allergies as of 08/10/2013  . (No Known Allergies)    Family History  Problem Relation Age of Onset  . Other Neg Hx   . Hypertension Father   . Kidney Stones Father   . Migraines Father   . Depression Father   . Muscular dystrophy Father   . Heart disease Maternal Grandmother   . Hypertension Maternal Grandmother   . Cancer Maternal Grandmother    kidney  . Depression Maternal Grandmother   . Heart disease Maternal Grandfather   . Hypertension Maternal Grandfather   . Depression Maternal Grandfather   . Heart disease Paternal Grandmother   . Hypertension Paternal Grandmother   . Depression Paternal Grandmother   . Muscular dystrophy Paternal Grandmother   . Heart disease Paternal Grandfather   . Hypertension Paternal Grandfather   . Cancer Paternal Grandfather     colon, leaukemia    History   Social History  . Marital Status: Married    Spouse Name: N/A    Number of Children: N/A  . Years of Education: N/A   Occupational History  . Not on file.   Social History Main Topics  . Smoking status: Never Smoker   . Smokeless tobacco: Never Used  . Alcohol Use: No  . Drug Use: No  . Sexual Activity: Yes    Birth Control/ Protection: None   Other Topics Concern  . Not on file   Social History Narrative  . No narrative on file       Physical Exam: BP 122/80  Pulse 72  Ht 5\' 4"  (1.626 m)  Wt 240 lb 3.2 oz (108.954 kg)  BMI 41.21 kg/m2  Breastfeeding? No Constitutional: generally well-appearing Psychiatric: alert and oriented x3 Eyes: extraocular movements intact  Mouth: oral pharynx moist, no lesions Neck: supple no lymphadenopathy Cardiovascular: heart regular rate and rhythm Lungs: clear to auscultation bilaterally Abdomen: soft, nontender, nondistended, no obvious ascites, no peritoneal signs, normal bowel sounds Extremities: no lower extremity edema bilaterally Skin: no lesions on visible extremities    Assessment and plan: 28 y.o. 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 She has however noticed some blood in her stools and with her chronic symptoms oh electrolytes laboratory bowel disease with colonoscopy. She will also have labs today according CBC, complete metabolic profile, thyroid testing, celiac sprue  panel, stool tests for Giardia, ova parasites, routine stool cultures. Recommended she stop all caffeine products for now. I am also going to call her in twice daily and a spasm medicine which has 80 spasmodic property as well as some mild constipating side effects which could be nice for her.

## 2013-08-13 LAB — CELIAC PANEL 10
Endomysial Screen: NEGATIVE
GLIADIN IGG: 6.6 U/mL (ref <20)
IgA: 7 mg/dL — ABNORMAL LOW (ref 69–380)
Tissue Transglut Ab: 6.1 U/mL (ref <20)
Tissue Transglutaminase Ab, IgA: <1 U/mL (ref <20)

## 2013-08-13 LAB — TSH: TSH: 2.63 u[IU]/mL (ref 0.35–5.50)

## 2013-08-14 ENCOUNTER — Telehealth: Payer: Self-pay | Admitting: Gastroenterology

## 2013-08-14 LAB — GIARDIA ANTIGEN: GIARDIA SCREEN (EIA): NEGATIVE

## 2013-08-14 NOTE — Telephone Encounter (Signed)
Pt notified that results have not been reviewed and she will be called as soon as available

## 2013-08-17 LAB — STOOL CULTURE

## 2013-08-20 ENCOUNTER — Encounter: Payer: Self-pay | Admitting: Gastroenterology

## 2013-08-24 ENCOUNTER — Encounter: Payer: Federal, State, Local not specified - PPO | Admitting: Gastroenterology

## 2013-08-27 ENCOUNTER — Encounter: Payer: Self-pay | Admitting: Gastroenterology

## 2013-08-27 ENCOUNTER — Ambulatory Visit (AMBULATORY_SURGERY_CENTER): Payer: Federal, State, Local not specified - PPO | Admitting: Gastroenterology

## 2013-08-27 VITALS — BP 103/55 | HR 75 | Temp 96.3°F | Resp 20 | Ht 64.0 in | Wt 240.0 lb

## 2013-08-27 DIAGNOSIS — R142 Eructation: Secondary | ICD-10-CM

## 2013-08-27 DIAGNOSIS — R141 Gas pain: Secondary | ICD-10-CM

## 2013-08-27 DIAGNOSIS — R197 Diarrhea, unspecified: Secondary | ICD-10-CM

## 2013-08-27 DIAGNOSIS — R143 Flatulence: Secondary | ICD-10-CM

## 2013-08-27 DIAGNOSIS — R140 Abdominal distension (gaseous): Secondary | ICD-10-CM

## 2013-08-27 MED ORDER — SODIUM CHLORIDE 0.9 % IV SOLN
500.0000 mL | INTRAVENOUS | Status: DC
Start: 2013-08-27 — End: 2013-08-27

## 2013-08-27 NOTE — Progress Notes (Signed)
Called to room to assist during endoscopic procedure.  Patient ID and intended procedure confirmed with present staff. Received instructions for my participation in the procedure from the performing physician.  

## 2013-08-27 NOTE — Op Note (Signed)
[facility] [address] [place], [id number]   COLONOSCOPY PROCEDURE REPORT  PATIENT: Kelsey Gonzales, [name]  MR#: [id number] BIRTHDATE: 01/15/1986 , 28  yrs. old GENDER: Female ENDOSCOPIST: Rachael Feeaniel P Jacobs, MD REFERRED XB:JYNWGNFAOBY:Kimberlee Shaw, M.D. PROCEDURE DATE:  08/27/2013 PROCEDURE:   Colonoscopy with biopsy First Screening Colonoscopy - Avg.  risk and is 50 yrs.  old or older - No.  Prior Negative Screening - Now for repeat screening. N/A  History of Adenoma - Now for follow-up colonoscopy & has been > or = to 3 yrs.  N/A  Polyps Removed Today? No.  Recommend repeat exam, <10 yrs? No. ASA CLASS:   Class II INDICATIONS:chronic diarrhea. MEDICATIONS: MAC sedation, administered by CRNA and propofol (Diprivan) 400mg  IV  DESCRIPTION OF PROCEDURE:   After the risks benefits and alternatives of the procedure were thoroughly explained, informed consent was obtained.  A digital rectal exam revealed no abnormalities of the rectum.   The LB [id number]  endoscope was introduced through the anus and advanced to the terminal ileum which was intubated for a short distance. No adverse events experienced.   The quality of the prep was excellent.  The instrument was then slowly withdrawn as the colon was fully examined.   COLON FINDINGS: A normal appearing cecum, ileocecal valve, and appendiceal orifice were identified.  The ascending, hepatic flexure, transverse, splenic flexure, descending, sigmoid colon and rectum appeared unremarkable.  No polyps or cancers were seen. The colon was randomly biopsied.   The mucosa appeared normal in the terminal ileum.  Retroflexed views revealed no abnormalities. The time to cecum=6 minutes 23 seconds.  Withdrawal time=5 minutes 37 seconds.  The scope was withdrawn and the procedure completed. COMPLICATIONS: There were no complications.  ENDOSCOPIC IMPRESSION: 1.   Normal colon, biopsied to check for Microscopic Colitis. 2.   Normal  mucosa in the terminal ileum  RECOMMENDATIONS: Await biopsy results.  Please start the twice daily antispasmodic medicine that was prescribed at time of your office visit.   eSigned:  Rachael Feeaniel P Jacobs, MD 08/27/2013 1:23 PM

## 2013-08-27 NOTE — Patient Instructions (Signed)

## 2013-08-27 NOTE — Progress Notes (Signed)
Procedure ends, to recovcery, report given and VSS.

## 2013-08-27 NOTE — Op Note (Signed)
[facility] [address] [place], [id number]   ENDOSCOPY PROCEDURE REPORT  PATIENT: Kelsey Gonzales, [name]  MR#: [id number] BIRTHDATE: [birth year] , 28  yrs. 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 MEDICATIONS: MAC sedation, administered by CRNA and Propofol (Diprivan) 130 mg IV TOPICAL ANESTHETIC: none  DESCRIPTION OF PROCEDURE: After the risks benefits and alternatives of the procedure were thoroughly explained, informed consent was obtained.  The LB [id number] endoscope was introduced through the mouth and advanced to the second portion of the duodenum. Without limitations.  The instrument was slowly withdrawn as the mucosa was fully examined.     The upper, middle and distal third of the esophagus were carefully inspected and no abnormalities were noted.  The z-line was well seen at the GEJ.  The endoscope was pushed into the fundus which was normal including a retroflexed view.  The antrum, gastric body, first and second part of the duodenum were unremarkable. Retroflexed views revealed no abnormalities.   The duodenum was biopsied and sent to pathology  The scope was then withdrawn from the patient and the procedure completed.  COMPLICATIONS: There were no complications. ENDOSCOPIC IMPRESSION: Normal EGD; duoenum was biopsied to check for Celiac Sprue  RECOMMENDATIONS: Await pathology results.  If normal, then would consider empiric treatment for giardia given low IgA level (as well as referral to hematology)   eSigned:  Rachael Feeaniel P Celedonio Sortino, MD 08/27/2013 1:26 PM ;  CC: Cam HaiKimberly Shaw, MD

## 2013-08-28 ENCOUNTER — Telehealth: Payer: Self-pay | Admitting: *Deleted

## 2013-08-28 NOTE — Telephone Encounter (Signed)
No answer, message left for the patient. 

## 2013-08-31 ENCOUNTER — Telehealth: Payer: Self-pay | Admitting: Gastroenterology

## 2013-08-31 NOTE — Telephone Encounter (Signed)
Pt was advised that she will be called as soon as reviewed

## 2013-09-03 ENCOUNTER — Telehealth: Payer: Self-pay | Admitting: Gastroenterology

## 2013-09-03 NOTE — Telephone Encounter (Signed)
The pt was notified that Dr Christella HartiganJacobs is the hospital MD and will review as soon as available

## 2013-09-04 ENCOUNTER — Telehealth: Payer: Self-pay | Admitting: Hematology and Oncology

## 2013-09-04 ENCOUNTER — Other Ambulatory Visit: Payer: Self-pay | Admitting: Hematology and Oncology

## 2013-09-04 ENCOUNTER — Other Ambulatory Visit: Payer: Self-pay

## 2013-09-04 ENCOUNTER — Telehealth: Payer: Self-pay | Admitting: Gastroenterology

## 2013-09-04 DIAGNOSIS — R799 Abnormal finding of blood chemistry, unspecified: Secondary | ICD-10-CM

## 2013-09-04 MED ORDER — METRONIDAZOLE 250 MG PO TABS: 500.0000 mg | ORAL_TABLET | Freq: Three times a day (TID) | ORAL | Status: DC

## 2013-09-04 NOTE — Telephone Encounter (Signed)
S/W PATIENT AND GAVE NP APPT FOR 06/04 @ 1:30 W/DR. GORSUCH.  REFERRING DR. DAN JACOBS DX- ABNORMAL BLOOD CHEMISTRY  WELCOME PACKET MAILED.

## 2013-09-04 NOTE — Telephone Encounter (Signed)
The pt was advised that the hematology appt was NOT for cancer but that the hematology dept is affiliated with oncology.  The pt felt better and will call with any other concerns

## 2013-09-04 NOTE — Telephone Encounter (Signed)
Left message on machine to call back  

## 2013-09-18 ENCOUNTER — Ambulatory Visit: Payer: Federal, State, Local not specified - PPO

## 2013-09-18 ENCOUNTER — Ambulatory Visit: Payer: Federal, State, Local not specified - PPO | Admitting: Hematology and Oncology

## 2013-09-27 ENCOUNTER — Ambulatory Visit (HOSPITAL_BASED_OUTPATIENT_CLINIC_OR_DEPARTMENT_OTHER): Payer: Federal, State, Local not specified - PPO

## 2013-09-27 ENCOUNTER — Ambulatory Visit (HOSPITAL_BASED_OUTPATIENT_CLINIC_OR_DEPARTMENT_OTHER): Payer: Federal, State, Local not specified - PPO | Admitting: Hematology and Oncology

## 2013-09-27 ENCOUNTER — Encounter: Payer: Self-pay | Admitting: Hematology and Oncology

## 2013-09-27 ENCOUNTER — Telehealth: Payer: Self-pay | Admitting: Hematology and Oncology

## 2013-09-27 ENCOUNTER — Ambulatory Visit: Payer: Federal, State, Local not specified - PPO

## 2013-09-27 VITALS — BP 123/62 | HR 118 | Temp 98.1°F | Resp 18 | Ht 64.0 in | Wt 243.6 lb

## 2013-09-27 DIAGNOSIS — M2550 Pain in unspecified joint: Secondary | ICD-10-CM

## 2013-09-27 DIAGNOSIS — D849 Immunodeficiency, unspecified: Secondary | ICD-10-CM

## 2013-09-27 DIAGNOSIS — R040 Epistaxis: Secondary | ICD-10-CM

## 2013-09-27 LAB — APTT: aPTT: 29.2 seconds (ref 24–37)

## 2013-09-27 LAB — PROTHROMBIN TIME
INR: 0.96 (ref <1.50)
Prothrombin Time: 12.6 seconds (ref 11.6–15.2)

## 2013-09-27 NOTE — Progress Notes (Signed)
[facility] CONSULT NOTE  Patient Care Team: Lupita Raider, MD as PCP - General (Family Medicine)  CHIEF COMPLAINTS/PURPOSE OF CONSULTATION:  IgA deficiency  HISTORY OF PRESENTING ILLNESS:  Kelsey Gonzales 28 y.o. 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 The patient has chronic upper respiratory tract infection with recurrent your infections since childhood. She had recurrent sinus infection requiring multiple courses of antibiotics but was never hospitalized for severe life-threatening infections. 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 She also has chronic joint pain. Recently, she has intermittent abdominal pain with bloating and was referred to gastroenterologist for evaluation for possible celiac sprue. The patient also complained of intermittent nosebleed on a frequent basis.  MEDICAL HISTORY:  Past Medical History  Diagnosis Date  . Abnormal Pap smear   . Hx of varicella     SURGICAL HISTORY: Past Surgical History  Procedure Laterality Date  . No past surgeries    . Myringotomy    . Tube in ears    . Adenoidectomy      SOCIAL HISTORY: History   Social History  . Marital Status: Married    Spouse Name: N/A    Number of Children: N/A  . Years of Education: N/A   Occupational History  . Not on file.   Social History Main Topics  . Smoking status: Never Smoker   . Smokeless tobacco: Never Used  . Alcohol Use: No  . Drug Use: No  . Sexual Activity: Yes    Birth Control/ Protection: Condom   Other Topics Concern  . Not on file   Social History Narrative  . No narrative on file    FAMILY HISTORY: Family History  Problem Relation Age of Onset  . Other Neg Hx   . Esophageal cancer Neg Hx   . Rectal cancer Neg Hx   . Stomach cancer Neg Hx   . Hypertension Father   . Kidney Stones Father   . Migraines Father   . Depression Father   . Muscular  dystrophy Father   . Heart disease Maternal Grandmother   . Hypertension Maternal Grandmother   . Cancer Maternal Grandmother     kidney  . Depression Maternal Grandmother   . Heart disease Maternal Grandfather   . Hypertension Maternal Grandfather   . Depression Maternal Grandfather   . Heart disease Paternal Grandmother   . Hypertension Paternal Grandmother   . Depression Paternal Grandmother   . Muscular dystrophy Paternal Grandmother   . Heart disease Paternal Grandfather   . Hypertension Paternal Grandfather   . Cancer Paternal Grandfather     colon, leaukemia  . Colon cancer Paternal Grandfather     ALLERGIES:  is allergic to chlorhexidine gluconate.  MEDICATIONS:  Current Outpatient Prescriptions  Medication Sig Dispense Refill  . ibuprofen (ADVIL,MOTRIN) 600 MG tablet Take 1 tablet (600 mg total) by mouth every 6 (six) hours.  30 tablet  1   No current facility-administered medications for this visit.    REVIEW OF SYSTEMS:   Constitutional: Denies fevers, chills or abnormal night sweats Eyes: Denies blurriness of vision, double vision or watery eyes Ears, nose, mouth, throat, and face: Denies mucositis or sore throat Respiratory: Denies cough, dyspnea or wheezes Cardiovascular: Denies palpitation, chest discomfort or lower extremity swelling Lymphatics: Denies new lymphadenopathy or easy bruising Neurological:Denies numbness, tingling or new weaknesses Behavioral/Psych: Mood is stable, no new changes  All other systems were reviewed  with the patient and are negative.  PHYSICAL EXAMINATION: ECOG PERFORMANCE STATUS: 0 - Asymptomatic  Filed Vitals:   09/27/13 1347  BP: 123/62  Pulse: 118  Temp: 98.1 F (36.7 C)  Resp: 18   Filed Weights   09/27/13 1347  Weight: 243 lb 9.6 oz (110.496 kg)    GENERAL:alert, no distress and comfortable. She is morbidly obese. SKIN: skin color, texture, turgor are normal, no rashes or significant lesions EYES: normal,  conjunctiva are pink and non-injected, sclera clear OROPHARYNX:no exudate, no erythema and lips, buccal mucosa, and tongue normal  NECK: supple, thyroid normal size, non-tender, without nodularity LYMPH:  no palpable lymphadenopathy in the cervical, axillary or inguinal LUNGS: clear to auscultation and percussion with normal breathing effort HEART: regular rate & rhythm and no murmurs and no lower extremity edema ABDOMEN:abdomen soft, non-tender and normal bowel sounds Musculoskeletal:no cyanosis of digits and no clubbing  PSYCH: alert & oriented x 3 with fluent speech NEURO: no focal motor/sensory deficits  LABORATORY DATA:  I have reviewed the data as listed Lab Results  Component Value Date   WBC 9.4 08/10/2013   HGB 13.2 08/10/2013   HCT 38.9 08/10/2013   MCV 82.9 08/10/2013   PLT 180.0 08/10/2013    Recent Labs  08/10/13 1619  NA 137  K 4.0  CL 103  CO2 26  GLUCOSE 102*  BUN 11  CREATININE 0.7  CALCIUM 9.6  PROT 7.7  ALBUMIN 4.1  AST 16  ALT 19  ALKPHOS 63  BILITOT 0.6   ASSESSMENT & PLAN:  Immune deficiency disorder The patient was symptomatic with recurrent infections with upper respiratory tract infections, abdominal bloating symptoms & diarrhea and skin rashes. Overall, her symptoms are not life-threatening. I provided a lot of education regarding risk of recurrent infection, association with autoimmune disorders with individuals with IgA deficiency and I also warned her about risk of anaphylaxis with transfusions. I will be go and order a panel of immunoglobulin just to make sure she does not pan immune deficiency. We also discussed about the importance of vaccination programs. Recommendation for life I risks such as shingles vaccine nation. The patient should receive pneumococcal vaccinations due to risk of pneumonia. I will call the patient with test results as I do not plan to bring her back unless her blood work suggest she has significant pan immune  deficiency that would warrant IVIG treatment.  Recurrent epistaxis I will order a screening test with aPTT and INR. I recommend referral to ENT for evaluation and she agreed to proceed.  Joint pain Due to association of IgA deficiency with autoimmune disorders, we'll proceed to order ANA screening test. In addition, I also recommend vitamin D supplementation.    Orders Placed This Encounter  Procedures  . IgG, IgA, IgM    Standing Status: Future     Number of Occurrences: 1     Standing Expiration Date: 09/27/2014  . Sedimentation rate    Standing Status: Future     Number of Occurrences: 1     Standing Expiration Date: 09/27/2014  . ANA    Standing Status: Future     Number of Occurrences: 1     Standing Expiration Date: 09/27/2014  . Protime-INR    Standing Status: Future     Number of Occurrences: 1     Standing Expiration Date: 09/27/2014  . APTT    Standing Status: Future     Number of Occurrences: 1     Standing Expiration Date:  09/27/2014  . Ambulatory referral to ENT    Referral Priority:  Routine    Referral Type:  Consultation    Referral Reason:  Specialty Services Required    Referred to Provider:  Flo ShanksKarol Wolicki, MD    Requested Specialty:  Otolaryngology    Number of Visits Requested:  1    All questions were answered. The patient knows to call the clinic with any problems, questions or concerns. I spent 40 minutes counseling the patient face to face. The total time spent in the appointment was 55 minutes and more than 50% was on counseling.     Artis DelayNi Wilburn Keir, MD 09/27/2013 9:42 PM

## 2013-09-27 NOTE — Assessment & Plan Note (Addendum)
Due to association of IgA deficiency with autoimmune disorders, we'll proceed to order ANA screening test. In addition, I also recommend vitamin D supplementation.

## 2013-09-27 NOTE — Assessment & Plan Note (Signed)
I will order a screening test with aPTT and INR. I recommend referral to ENT for evaluation and she agreed to proceed.

## 2013-09-27 NOTE — Assessment & Plan Note (Addendum)
The patient was symptomatic with recurrent infections with upper respiratory tract infections, abdominal bloating symptoms & diarrhea and skin rashes. Overall, her symptoms are not life-threatening. I provided a lot of education regarding risk of recurrent infection, association with autoimmune disorders with individuals with IgA deficiency and I also warned her about risk of anaphylaxis with transfusions. I will be go and order a panel of immunoglobulin just to make sure she does not pan immune deficiency. We also discussed about the importance of vaccination programs. Recommendation for life I risks such as shingles vaccine nation. The patient should receive pneumococcal vaccinations due to risk of pneumonia. I will call the patient with test results as I do not plan to bring her back unless her blood work suggest she has significant pan immune deficiency that would warrant IVIG treatment.

## 2013-09-27 NOTE — Telephone Encounter (Signed)
S/w the pt and she wanted to set up her appt with the ent herself since her dtr is a pt there already. Sent the pt back to the lab.

## 2013-09-27 NOTE — Progress Notes (Signed)
Checked in new pt with no financial concerns. °

## 2013-09-28 LAB — ANTI-NUCLEAR AB-TITER (ANA TITER): ANA Titer 1: 1:160 {titer} — ABNORMAL HIGH

## 2013-09-28 LAB — IGG, IGA, IGM
IGA: 6 mg/dL — AB (ref 69–380)
IGG (IMMUNOGLOBIN G), SERUM: 1210 mg/dL (ref 690–1700)
IgM, Serum: 185 mg/dL (ref 52–322)

## 2013-09-28 LAB — SEDIMENTATION RATE: Sed Rate: 15 mm/hr (ref 0–22)

## 2013-09-28 LAB — ANA: Anti Nuclear Antibody(ANA): POSITIVE — AB

## 2013-10-01 ENCOUNTER — Other Ambulatory Visit: Payer: Self-pay | Admitting: Hematology and Oncology

## 2013-10-01 ENCOUNTER — Telehealth: Payer: Self-pay | Admitting: Hematology and Oncology

## 2013-10-01 ENCOUNTER — Encounter: Payer: Self-pay | Admitting: Hematology and Oncology

## 2013-10-01 DIAGNOSIS — R7689 Other specified abnormal immunological findings in serum: Secondary | ICD-10-CM

## 2013-10-01 DIAGNOSIS — R768 Other specified abnormal immunological findings in serum: Secondary | ICD-10-CM | POA: Insufficient documentation

## 2013-10-01 DIAGNOSIS — M2550 Pain in unspecified joint: Secondary | ICD-10-CM

## 2013-10-01 HISTORY — DX: Other specified abnormal immunological findings in serum: R76.8

## 2013-10-01 HISTORY — DX: Other specified abnormal immunological findings in serum: R76.89

## 2013-10-01 NOTE — Telephone Encounter (Signed)
I reviewed test results with the patient. 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 Screening for bleeding disorder came back negative with normal APTT and PTT. I recommend her to proceed with ENT consult for recurrent epistaxis. I ordered a screening test for autoimmune disease due to strong association with IgA deficiency and it came back positive with a high titer. I recommend rheumatology consult.

## 2013-10-01 NOTE — Telephone Encounter (Signed)
Sent msg to [facility]gger[facility] to send over records. Once pt info reviewed by Dr. Dierdre Forth his office will call pt.

## 2013-10-02 ENCOUNTER — Telehealth: Payer: Self-pay | Admitting: Hematology and Oncology

## 2013-10-02 NOTE — Telephone Encounter (Signed)
Faxed pt medical records to Dr. Beekman [phone] °

## 2013-10-05 ENCOUNTER — Telehealth: Payer: Self-pay | Admitting: *Deleted

## 2013-10-05 NOTE — Telephone Encounter (Signed)
Pt aware of [facility] at 8:30 am.

## 2013-10-30 ENCOUNTER — Other Ambulatory Visit: Payer: Self-pay | Admitting: Family Medicine

## 2013-10-30 DIAGNOSIS — R1013 Epigastric pain: Secondary | ICD-10-CM

## 2013-10-31 ENCOUNTER — Ambulatory Visit
Admission: RE | Admit: 2013-10-31 | Discharge: 2013-10-31 | Disposition: A | Discharge status: Home / Self Care | Payer: Federal, State, Local not specified - PPO | Source: Ambulatory Visit | Attending: Family Medicine | Admitting: Family Medicine

## 2013-10-31 DIAGNOSIS — R1013 Epigastric pain: Secondary | ICD-10-CM

## 2013-11-02 ENCOUNTER — Other Ambulatory Visit: Payer: Self-pay | Admitting: Obstetrics and Gynecology

## 2013-11-05 ENCOUNTER — Ambulatory Visit: Payer: Federal, State, Local not specified - PPO | Admitting: Gastroenterology

## 2013-11-05 LAB — CYTOLOGY - PAP

## 2014-02-25 ENCOUNTER — Encounter: Payer: Self-pay | Admitting: Hematology and Oncology

## 2014-05-13 ENCOUNTER — Other Ambulatory Visit (INDEPENDENT_AMBULATORY_CARE_PROVIDER_SITE_OTHER): Payer: Self-pay

## 2014-05-21 ENCOUNTER — Other Ambulatory Visit: Payer: Self-pay | Admitting: Obstetrics and Gynecology

## 2014-06-01 ENCOUNTER — Encounter: Payer: Federal, State, Local not specified - PPO | Attending: Surgery | Admitting: Dietician

## 2014-06-01 ENCOUNTER — Encounter: Payer: Self-pay | Admitting: Dietician

## 2014-06-01 DIAGNOSIS — Z713 Dietary counseling and surveillance: Secondary | ICD-10-CM | POA: Diagnosis not present

## 2014-06-01 DIAGNOSIS — Z6841 Body Mass Index (BMI) 40.0 and over, adult: Secondary | ICD-10-CM | POA: Diagnosis not present

## 2014-06-01 NOTE — Patient Instructions (Addendum)
Follow Pre-Op Goals Try Protein Shakes Attend 4 supervised weight loss appointments  [facility] at [phone] when surgery is scheduled to enroll in Pre-Op Class  Things to remember:  Please always be honest with us. We want to support you!  If you have any questions or concerns in between appointments, please call or email Jennette BankerLiz, Leslie, or Jacki ConesLaurie.  The diet after surgery will be high protein and low in carbohydrate.  Vitamins and calcium need to be taken for the rest of your life.  Feel free to include support people in any classes or appointments.

## 2014-06-01 NOTE — Progress Notes (Signed)
  Pre-Op Assessment Visit:  Pre-Operative LAGB Surgery  Medical Nutrition Therapy:  Appt start time: 1400   End time:  1430.  Patient was seen on 06/01/2014 for Pre-Operative Nutrition Assessment. Assessment and letter of approval [facility]ed to [facility] coordinator on 06/01/2014.   Preferred Learning Style:   No preference indicated   Learning Readiness:   Ready  Handouts given during visit include:  Pre-Op Goals Bariatric Surgery Protein Shakes   During the appointment today the following Pre-Op Goals were reviewed with the patient: Maintain or lose weight as instructed by your surgeon Make healthy food choices Begin to limit portion sizes Limited concentrated sugars and fried foods Keep fat/sugar in the single digits per serving on   food labels Practice CHEWING your food  (aim for 30 chews per bite or until applesauce consistency) Practice not drinking 15 minutes before, during, and 30 minutes after each meal/snack Avoid all carbonated beverages  Avoid/limit caffeinated beverages  Avoid all sugar-sweetened beverages Consume 3 meals per day; eat every 3-5 hours Make a list of non-food related activities Aim for 64-100 ounces of FLUID daily  Aim for at least 60-80 grams of PROTEIN daily Look for a liquid protein source that contain ?15 g protein and ?5 g carbohydrate  (ex: shakes, drinks, shots)  Patient-Centered Goals: Malajah would like to avoid diabetes and be healthier for her children. Kelsey Gonzales is very confident that she will be able to follow the pre op goals and feels they are very important.  Demonstrated degree of understanding via:  Teach Back  Teaching Method Utilized:  Visual Auditory Hands on  Barriers to learning/adherence to lifestyle change: none  [facility] to [facility] to enroll in Pre-Op and Post-Op Nutrition Education when surgery date is scheduled.

## 2014-06-04 ENCOUNTER — Ambulatory Visit (HOSPITAL_COMMUNITY): Payer: Federal, State, Local not specified - PPO

## 2014-06-14 ENCOUNTER — Ambulatory Visit (HOSPITAL_COMMUNITY)
Admission: RE | Admit: 2014-06-14 | Discharge: 2014-06-14 | Disposition: A | Discharge status: Home / Self Care | Payer: Federal, State, Local not specified - PPO | Source: Ambulatory Visit | Attending: Surgery | Admitting: Surgery

## 2014-06-14 ENCOUNTER — Ambulatory Visit (HOSPITAL_COMMUNITY): Payer: Federal, State, Local not specified - PPO

## 2014-06-14 ENCOUNTER — Other Ambulatory Visit: Payer: Self-pay

## 2014-06-14 DIAGNOSIS — Z01818 Encounter for other preprocedural examination: Secondary | ICD-10-CM | POA: Insufficient documentation

## 2014-06-27 ENCOUNTER — Ambulatory Visit: Payer: Federal, State, Local not specified - PPO | Admitting: Dietician

## 2014-07-01 ENCOUNTER — Ambulatory Visit: Payer: Federal, State, Local not specified - PPO | Admitting: Dietician

## 2014-07-02 ENCOUNTER — Encounter: Payer: Federal, State, Local not specified - PPO | Attending: Surgery | Admitting: Dietician

## 2014-07-02 DIAGNOSIS — Z6841 Body Mass Index (BMI) 40.0 and over, adult: Secondary | ICD-10-CM | POA: Insufficient documentation

## 2014-07-02 DIAGNOSIS — Z713 Dietary counseling and surveillance: Secondary | ICD-10-CM | POA: Diagnosis not present

## 2014-07-02 NOTE — Patient Instructions (Signed)
Start working on not drink 15 minutes before a meal through 30 after a meal. Continue cutting back on sweet tea (limit sugar and caffeine). Decaf and with splenda is ok for tea.  Start working on cutting back on food with and sugar and that are fried. Work on increase fluid to 64 oz per day. Look into joining a gym.

## 2014-07-02 NOTE — Progress Notes (Signed)
  4 Months Supervised Weight Loss Visit:   Pre-Operative LAGB Surgery  Medical Nutrition Therapy:  Appt start time: 1145 end time:  1200.  Primary concerns today: Supervised Weight Loss Visit #1. Returns with a 2 lbs weight gain since last month. Has been working on chewing foods well, drinking more water, and limiting the sweet tea. Eating 3 meals per day. Not doing any physical activity yet. Tried a protein that she did not like (doesn't remember the name).   Wt Readings from Last 3 Encounters:  07/02/14 241 lb 8 oz (109.544 kg)  06/01/14 238 lb 11.2 oz (108.274 kg)  09/27/13 243 lb 9.6 oz (110.496 kg)   Ht Readings from Last 3 Encounters:  07/02/14 5\' 4"  (1.626 m)  06/01/14 5\' 4"  (1.626 m)  09/27/13 5\' 4"  (1.626 m)   Body mass index is 41.43 kg/(m^2). @BMIFA @ Normalized weight-for-age data available only for age 78 to 20 years. Normalized stature-for-age data available only for age 78 to 20 years.   Preferred Learning Style:   No preference indicated   Learning Readiness:   Ready  Medications: see list  Recent physical activity:  None, though has an active job and active with kids  Progress Towards Goal(s):  In progress.  Nutritional Diagnosis:  [place]-3.3 Obesity related to past poor dietary habits and physical inactivity as evidenced by patient attending supervised weight loss for insurance approval of bariatric surgery.    Intervention:  Nutrition counseling provided. Plan: Start working on not drink 15 minutes before a meal through 30 after a meal. Continue cutting back on sweet tea (limit sugar and caffeine). Decaf and with splenda is ok for tea.  Start working on cutting back on food with and sugar and that are fried. Work on increase fluid to 64 oz per day. Look into joining a gym.  Teaching Method Utilized:  Visual Auditory Hands on  Barriers to learning/adherence to lifestyle change: none  Demonstrated degree of understanding via:  Teach Back    Monitoring/Evaluation:  Dietary intake, exercise, and body weight. Follow up in 1 months for 4 month supervised weight loss visit.

## 2014-07-30 ENCOUNTER — Encounter: Payer: Federal, State, Local not specified - PPO | Attending: Surgery | Admitting: Dietician

## 2014-07-30 VITALS — Ht 64.0 in | Wt 243.6 lb

## 2014-07-30 DIAGNOSIS — Z713 Dietary counseling and surveillance: Secondary | ICD-10-CM | POA: Diagnosis not present

## 2014-07-30 DIAGNOSIS — Z6841 Body Mass Index (BMI) 40.0 and over, adult: Secondary | ICD-10-CM | POA: Diagnosis not present

## 2014-07-30 DIAGNOSIS — E669 Obesity, unspecified: Secondary | ICD-10-CM

## 2014-07-30 NOTE — Progress Notes (Signed)
  4 Months Supervised Weight Loss Visit:   Pre-Operative LAGB Surgery  Medical Nutrition Therapy:  Appt start time: 200 end time:  215.  Primary concerns today: Supervised Weight Loss Visit #2. Returns with a 2 lbs weight gain since last month. Has been working on cutting back on sweets and sweet tea, walking on night, and trying to increase fluid intake. Have some LaCroix water (carbonated). Has not tried more protein shakes yet. Cutting back on drinking during meals, though this has been difficult.   Wt Readings from Last 3 Encounters:  07/30/14 243 lb 9.6 oz (110.496 kg)  07/02/14 241 lb 8 oz (109.544 kg)  06/01/14 238 lb 11.2 oz (108.274 kg)   Ht Readings from Last 3 Encounters:  07/30/14 5\' 4"  (1.626 m)  07/02/14 5\' 4"  (1.626 m)  06/01/14 5\' 4"  (1.626 m)   Body mass index is 41.79 kg/(m^2). @BMIFA @ Normalized weight-for-age data available only for age 39 to 20 years. Normalized stature-for-age data available only for age 39 to 20 years.    Preferred Learning Style:   No preference indicated   Learning Readiness:   Ready  Medications: see list  Recent physical activity:  Walks 2-3 x week with kids for about 40 minutes  Progress Towards Goal(s):  In progress.  Nutritional Diagnosis:  [place]-3.3 Obesity related to past poor dietary habits and physical inactivity as evidenced by patient attending supervised weight loss for insurance approval of bariatric surgery.    Intervention:  Nutrition counseling provided. Plan: Continue working on not drink 15 minutes before a meal through 30 after a meal. Continue cutting back on sweet tea (limit sugar and caffeine). Decaf and with splenda is ok for tea.  Continue cutting back on food with and sugar and that are fried. Work on increase fluid to 64 oz per day - try crystal light, Mio drops sugar free Hawaiian Punch Keep walking at night. Try another protein shake - try Premier (insurenutrition.com will likely cover the cost) or Atkins  Lift (clear drink).  Teaching Method Utilized:  Visual Auditory Hands on  Barriers to learning/adherence to lifestyle change: none  Demonstrated degree of understanding via:  Teach Back   Monitoring/Evaluation:  Dietary intake, exercise, and body weight. Follow up in 1 months for 4 month supervised weight loss visit.

## 2014-07-30 NOTE — Patient Instructions (Addendum)
Continue working on not drink 15 minutes before a meal through 30 after a meal. Continue cutting back on sweet tea (limit sugar and caffeine). Decaf and with splenda is ok for tea.  Continue cutting back on food with and sugar and that are fried. Work on increase fluid to 64 oz per day - try crystal light, Mio drops sugar free Hawaiian Punch Keep walking at night. Try another protein shake - try Premier ([email or web address].[email or web address] will likely cover the cost) or Atkins Lift (clear drink).

## 2014-08-28 ENCOUNTER — Encounter: Payer: Federal, State, Local not specified - PPO | Attending: Surgery | Admitting: Dietician

## 2014-08-28 DIAGNOSIS — Z713 Dietary counseling and surveillance: Secondary | ICD-10-CM | POA: Diagnosis not present

## 2014-08-28 DIAGNOSIS — Z6841 Body Mass Index (BMI) 40.0 and over, adult: Secondary | ICD-10-CM | POA: Diagnosis not present

## 2014-08-28 NOTE — Patient Instructions (Addendum)
Continue working on not drink 15 minutes before a meal through 30 after a meal. Continue cutting back on sweet tea (limit sugar and caffeine). Decaf and with splenda is ok for tea.  Continue cutting back on food with and sugar and that are fried. Work on increase fluid to 64 oz per day - try more flavors of crystal light, Mio drops, or add lemon to water. Keep walking at night or add some small walks during the day.  Don't buy peanut M&Ms.

## 2014-08-28 NOTE — Progress Notes (Signed)
  4 Months Supervised Weight Loss Visit:   Pre-Operative LAGB Surgery  Medical Nutrition Therapy:  Appt start time: 300 end time:  315.  Primary concerns today: Supervised Weight Loss Visit #3. Returns with a 1 lbs weight gain since last month. Has tried [employer] Protein and skinny girl protein and she liked them both. Has been struggling to cut out sweet and fried foods. Having trouble switching to Splenda. Having half sweet tea and walking at nights when she can. Still working on increasing fluid intake. Still having some having some LaCroix water (carbonated). Can drink more water at night than during the day.    Preferred Learning Style:   No preference indicated   Learning Readiness:   Ready  Medications: see list  Recent physical activity:  Walks 2-3 x week with kids for about 30 minutes  Progress Towards Goal(s):  In progress.  Nutritional Diagnosis:  [place]-3.3 Obesity related to past poor dietary habits and physical inactivity as evidenced by patient attending supervised weight loss for insurance approval of bariatric surgery.    Intervention:  Nutrition counseling provided. Plan: Continue working on not drink 15 minutes before a meal through 30 after a meal. Continue cutting back on sweet tea (limit sugar and caffeine). Decaf and with splenda is ok for tea.  Continue cutting back on food with and sugar and that are fried. Work on increase fluid to 64 oz per day - try more flavors of crystal light, Mio drops, or add lemon to water. Keep walking at night or add some small walks during the day.  Don't buy peanut M&Ms.   Teaching Method Utilized:  Visual Auditory Hands on  Barriers to learning/adherence to lifestyle change: none  Demonstrated degree of understanding via:  Teach Back   Monitoring/Evaluation:  Dietary intake, exercise, and body weight. Follow up in 1 months for 4 month supervised weight loss visit.

## 2014-08-29 ENCOUNTER — Ambulatory Visit: Payer: Federal, State, Local not specified - PPO | Admitting: Dietician

## 2014-09-26 ENCOUNTER — Encounter: Payer: Self-pay | Admitting: Dietician

## 2014-09-26 ENCOUNTER — Encounter: Payer: Federal, State, Local not specified - PPO | Attending: Surgery | Admitting: Dietician

## 2014-09-26 VITALS — Ht 64.0 in | Wt 244.9 lb

## 2014-09-26 DIAGNOSIS — Z713 Dietary counseling and surveillance: Secondary | ICD-10-CM | POA: Diagnosis not present

## 2014-09-26 DIAGNOSIS — Z6841 Body Mass Index (BMI) 40.0 and over, adult: Secondary | ICD-10-CM | POA: Insufficient documentation

## 2014-09-26 DIAGNOSIS — E669 Obesity, unspecified: Secondary | ICD-10-CM

## 2014-09-26 NOTE — Progress Notes (Signed)
  4 Months Supervised Weight Loss Visit:   Pre-Operative LAGB Surgery  Medical Nutrition Therapy:  Appt start time: 800 end time:  815.  Primary concerns today: Supervised Weight Loss Visit #4. Returns with no weight change since last month. Has not been walking as much d/t busy schedule. Has limited sweets such as M&Ms. Working having decaf tea. Still increasing fluid intake. Has cut out fried foods. Eating 3 meals per day.   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  Nutritional Diagnosis:  [place]-3.3 Obesity related to past poor dietary habits and physical inactivity as evidenced by patient attending supervised weight loss for insurance approval of bariatric surgery.    Intervention:  Nutrition counseling provided. Plan: Continue working on not drink 15 minutes before a meal through 30 after a meal. Continue cutting back on sweet tea (limit sugar and caffeine). Decaf and with splenda is ok for tea.  Continue cutting back on food with and sugar. Work on increase fluid to 64 oz per day - try more flavors of crystal light, Mio drops, or add lemon/cucumber to water. Plan to walk 4 x week.  Check out insurenutrition.com to get Premier shakes covered.   [facility] at [phone] when surgery is scheduled to enroll in Pre-Op Class  Teaching Method Utilized:  Visual Auditory Hands on  Barriers to learning/adherence to lifestyle change: none  Demonstrated degree of understanding via:  Teach Back   Monitoring/Evaluation:  Dietary intake, exercise, and body weight. Follow up to attend Pre-Op Class

## 2014-09-26 NOTE — Patient Instructions (Addendum)
Continue working on not drink 15 minutes before a meal through 30 after a meal. Continue cutting back on sweet tea (limit sugar and caffeine). Decaf and with splenda is ok for tea.  Continue cutting back on food with and sugar. Work on increase fluid to 64 oz per day - try more flavors of crystal light, Mio drops, or add lemon/cucumber to water. Plan to walk 4 x week.  Check out insurenutrition.com to get Premier shakes covered.   Call [facility] at [phone] when surgery is scheduled to enroll in Pre-Op Class

## 2014-11-14 ENCOUNTER — Other Ambulatory Visit: Payer: Self-pay | Admitting: Obstetrics and Gynecology

## 2014-11-15 LAB — CYTOLOGY - PAP

## 2014-11-18 ENCOUNTER — Encounter: Payer: Federal, State, Local not specified - PPO | Attending: Surgery

## 2014-11-18 DIAGNOSIS — Z6841 Body Mass Index (BMI) 40.0 and over, adult: Secondary | ICD-10-CM | POA: Insufficient documentation

## 2014-11-18 DIAGNOSIS — Z713 Dietary counseling and surveillance: Secondary | ICD-10-CM | POA: Diagnosis not present

## 2014-11-18 NOTE — Progress Notes (Signed)
  Pre-Operative Nutrition Class:  Appt start time: 1735   End time:  1830.  Patient was seen on 11/18/14 for Pre-Operative Bariatric Surgery Education [facility].   Surgery date:  Surgery type: LAGB Start weight at [facility]: 239 lbs on 06/01/14 Weight today: 249 lbs  TANITA  BODY COMP RESULTS  11/18/14   BMI (kg/m^2) 42.7   Fat Mass (lbs) 126.5   Fat Free Mass (lbs) 122.5   Total Body Water (lbs) 89.5   Samples given per MNT protocol. Patient educated on appropriate usage: Premier protein shake (chocolate - qty 1) Lot #: [id number] Exp: 07/2015  Celebrate Calcium Citrate chew (berry - qty 1) Lot #: [id number] Exp: 06/2016  Renee Pain Protein Powder (vanilla - qty 1) Lot #: [id number] Exp: 10/2015  The following the learning objectives were met by the patient during this course:  Identify Pre-Op Dietary Goals and will begin 2 weeks pre-operatively  Identify appropriate sources of fluids and proteins   State protein recommendations and appropriate sources pre and post-operatively  Identify Post-Operative Dietary Goals and will follow for 2 weeks post-operatively  Identify appropriate multivitamin and calcium sources  Describe the need for physical activity post-operatively and will follow MD recommendations  State when to call healthcare provider regarding medication questions or post-operative complications  Handouts given during class include:  Pre-Op Bariatric Surgery Diet Handout  Protein Shake Handout  Post-Op Bariatric Surgery Nutrition Handout  BELT Program Information Flyer  Support Group Information Flyer  [facility] Bariatric Supplements Price List  Follow-Up Plan: Patient will follow-up at [facility] 2 weeks post operatively for diet advancement per MD.

## 2014-11-28 ENCOUNTER — Ambulatory Visit: Payer: Self-pay | Admitting: Surgery

## 2014-11-28 NOTE — H&P (Signed)
Chief Complaint:  Morbid obesity and PCOS  History of Present Illness:  Kelsey Gonzales is an 29 y.o. female whose mother, Allyne Gee, has been very successful with Lapband.  She was seen in the office and we discussed sleeve gastrectomy and Lapband and she has decided on lapband placement.  All of her questions have been answered.  She had her hysterctomy back in January and did well  She is ready for Lapband surgery on August 16.    Past Medical History  Diagnosis Date  . Abnormal Pap smear   . Hx of varicella   . Elevated antinuclear antibody (ANA) level 10/01/2013    Past Surgical History  Procedure Laterality Date  . No past surgeries    . Myringotomy    . Tube in ears    . Adenoidectomy    . Abdominal hysterectomy      Current Outpatient Prescriptions  Medication Sig Dispense Refill  . Ascorbic Acid (VITAMIN C) 100 MG tablet Take 100 mg by mouth daily.    . ergocalciferol (VITAMIN D2) 50000 UNITS capsule Take 50,000 Units by mouth once a week.    Marland Kitchen ibuprofen (ADVIL,MOTRIN) 600 MG tablet Take 1 tablet (600 mg total) by mouth every 6 (six) hours. (Patient not taking: Reported on 07/02/2014) 30 tablet 1  . Vitamin D, Cholecalciferol, 1000 UNITS TABS Take by mouth.     No current facility-administered medications for this visit.   Chlorhexidine gluconate Family History  Problem Relation Age of Onset  . Other Neg Hx   . Esophageal cancer Neg Hx   . Rectal cancer Neg Hx   . Stomach cancer Neg Hx   . Hypertension Father   . Kidney Stones Father   . Migraines Father   . Depression Father   . Muscular dystrophy Father   . Heart disease Maternal Grandmother   . Hypertension Maternal Grandmother   . Cancer Maternal Grandmother     kidney  . Depression Maternal Grandmother   . Heart disease Maternal Grandfather   . Hypertension Maternal Grandfather   . Depression Maternal Grandfather   . Heart disease Paternal Grandmother   . Hypertension Paternal Grandmother   . Depression  Paternal Grandmother   . Muscular dystrophy Paternal Grandmother   . Heart disease Paternal Grandfather   . Hypertension Paternal Grandfather   . Cancer Paternal Grandfather     colon, leaukemia  . Colon cancer Paternal Grandfather    Social History:   reports that she has never smoked. She has never used smokeless tobacco. She reports that she does not drink alcohol or use illicit drugs.   REVIEW OF SYSTEMS : Negative except for see problem list  Physical Exam:   Last menstrual period 08/18/2013. There is no weight on file to calculate BMI.  Gen:  WDWN WF NAD  Neurological: Alert and oriented to person, place, and time. Motor and sensory function is grossly intact  Head: Normocephalic and atraumatic.  Eyes: Conjunctivae are normal. Pupils are equal, round, and reactive to light. No scleral icterus.  Neck: Normal range of motion. Neck supple. No tracheal deviation or thyromegaly present.  Cardiovascular:  SR without murmurs or gallops.  No carotid bruits Breast:  Not examined Respiratory: Effort normal.  No respiratory distress. No chest wall tenderness. Breath sounds normal.  No wheezes, rales or rhonchi.  Abdomen:  Obese and nontender GU:  Not examined Musculoskeletal: Normal range of motion. Extremities are nontender. No cyanosis, edema or clubbing noted Lymphadenopathy: No cervical, preauricular,  postauricular or axillary adenopathy is present Skin: Skin is warm and dry. No rash noted. No diaphoresis. No erythema. No pallor. Pscyh: Normal mood and affect. Behavior is normal. Judgment and thought content normal.   LABORATORY RESULTS: No results found for this or any previous visit (from the past 48 hour(s)).   RADIOLOGY RESULTS: No results found.  Problem List: Patient Active Problem List   Diagnosis Date Noted  . Elevated antinuclear antibody (ANA) level 10/01/2013  . 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 Recurrent epistaxis 09/27/2013  . Joint pain 09/27/2013     Assessment & Plan: Morbid obesity and PCOS-  Lapband surgery     Matt B. Daphine Deutscher, MD, [facility] [phone] beeper [phone]  11/28/2014 10:32 AM

## 2014-11-29 NOTE — Progress Notes (Signed)
Abbreviations noted on surgical consent. Please clarify surgical consent order. Patients PAT appt is scheduled for Monday 12/02/2014. Thanks.

## 2014-12-02 ENCOUNTER — Encounter (HOSPITAL_COMMUNITY)
Admission: RE | Admit: 2014-12-02 | Discharge: 2014-12-02 | Disposition: A | Discharge status: Home / Self Care | Payer: Federal, State, Local not specified - PPO | Source: Ambulatory Visit | Attending: Surgery | Admitting: Surgery

## 2014-12-02 ENCOUNTER — Encounter (HOSPITAL_COMMUNITY): Payer: Self-pay

## 2014-12-02 DIAGNOSIS — Z79899 Other long term (current) drug therapy: Secondary | ICD-10-CM | POA: Diagnosis not present

## 2014-12-02 DIAGNOSIS — E282 Polycystic ovarian syndrome: Secondary | ICD-10-CM | POA: Diagnosis not present

## 2014-12-02 DIAGNOSIS — Z6841 Body Mass Index (BMI) 40.0 and over, adult: Secondary | ICD-10-CM | POA: Diagnosis not present

## 2014-12-02 HISTORY — DX: Nausea with vomiting, unspecified: R11.2

## 2014-12-02 HISTORY — DX: Polycystic ovarian syndrome: E28.2

## 2014-12-02 HISTORY — DX: Other specified postprocedural states: Z98.890

## 2014-12-02 HISTORY — DX: Selective deficiency of immunoglobulin a (iga): D80.2

## 2014-12-02 LAB — CBC WITH DIFFERENTIAL/PLATELET
Basophils Absolute: 0 10*3/uL (ref 0.0–0.1)
Basophils Relative: 0 % (ref 0–1)
EOS ABS: 0 10*3/uL (ref 0.0–0.7)
EOS PCT: 0 % (ref 0–5)
HEMATOCRIT: 37.2 % (ref 36.0–46.0)
HEMOGLOBIN: 12.3 g/dL (ref 12.0–15.0)
Lymphocytes Relative: 27 % (ref 12–46)
Lymphs Abs: 1.7 10*3/uL (ref 0.7–4.0)
MCH: 26.7 pg (ref 26.0–34.0)
MCHC: 33.1 g/dL (ref 30.0–36.0)
MCV: 80.7 fL (ref 78.0–100.0)
MONOS PCT: 9 % (ref 3–12)
Monocytes Absolute: 0.6 10*3/uL (ref 0.1–1.0)
Neutro Abs: 4 10*3/uL (ref 1.7–7.7)
Neutrophils Relative %: 64 % (ref 43–77)
Platelets: 225 10*3/uL (ref 150–400)
RBC: 4.61 MIL/uL (ref 3.87–5.11)
RDW: 14.4 % (ref 11.5–15.5)
WBC: 6.3 10*3/uL (ref 4.0–10.5)

## 2014-12-02 LAB — COMPREHENSIVE METABOLIC PANEL
ALBUMIN: 4.4 g/dL (ref 3.5–5.0)
ALT: 18 U/L (ref 14–54)
AST: 19 U/L (ref 15–41)
Alkaline Phosphatase: 54 U/L (ref 38–126)
Anion gap: 6 (ref 5–15)
BUN: 16 mg/dL (ref 6–20)
CO2: 27 mmol/L (ref 22–32)
Calcium: 9.7 mg/dL (ref 8.9–10.3)
Chloride: 105 mmol/L (ref 101–111)
Creatinine, Ser: 0.73 mg/dL (ref 0.44–1.00)
GFR calc Af Amer: >60 mL/min (ref >60)
GFR calc non Af Amer: >60 mL/min (ref >60)
Glucose, Bld: 91 mg/dL (ref 65–99)
Potassium: 3.9 mmol/L (ref 3.5–5.1)
SODIUM: 138 mmol/L (ref 135–145)
Total Bilirubin: 0.5 mg/dL (ref 0.3–1.2)
Total Protein: 7.9 g/dL (ref 6.5–8.1)

## 2014-12-02 NOTE — Progress Notes (Signed)
Abbreviations note per surgical consent. Please clarify. Thanks.

## 2014-12-02 NOTE — Patient Instructions (Addendum)
Kelsey Gonzales  12/02/2014   Your procedure is scheduled on: Tuesday December 03, 2014   Report to [facility] Main  Entrance take [place]  elevators to 3rd floor to  Short Stay Center at 7:45 AM.  Call this number if you have problems the morning of surgery [phone]   Remember: ONLY 1 PERSON MAY GO WITH YOU TO SHORT STAY TO GET  READY MORNING OF YOUR SURGERY.  Do not eat food or drink liquids :After Midnight.     Take these medicines the morning of surgery with A SIP OF WATER: NONE                               You may not have any metal on your body including hair pins and              piercings  Do not wear jewelry, make-up, lotions, powders or perfumes, deodorant             Do not wear nail polish.  Do not shave  48 hours prior to surgery.              Do not bring valuables to the hospital. [facility] IS NOT             RESPONSIBLE   FOR VALUABLES.  Contacts, dentures or bridgework may not be worn into surgery.  Leave suitcase in the car. After surgery it may be brought to your room.     _____________________________________________________________________             [facility] - Preparing for Surgery Before surgery, you can play an important role.  Because skin is not sterile, your skin needs to be as free of germs as possible.  You can reduce the number of germs on your skin by washing with Dial soap before surgery. Do not shave (including legs and underarms) for at least 48 hours prior to the first antibacterial shower.  Please follow these instructions carefully:   Shower with Dial Soap the night before surgery and the  morning of Surgery.   If you choose to wash your hair, wash your hair first as usual with your  normal  shampoo.  After you shampoo, rinse your hair and body thoroughly to remove the  shampoo.                                                   Thoroughly rinse your body with warm water from the neck down.  DO NOT shower/wash with  your normal soap after using and rinsing off  The Dial Soap.             Pat yourself dry with a clean towel.             Wear clean pajamas.             Place clean sheets on your bed the night of your first shower and do not  sleep with pets. Day of Surgery : Do not apply any lotions/deodorants the morning of surgery.  Please wear clean clothes to the hospital/surgery center.  FAILURE TO FOLLOW THESE INSTRUCTIONS MAY RESULT IN THE CANCELLATION OF YOUR SURGERY PATIENT SIGNATURE_________________________________  NURSE SIGNATURE__________________________________  ________________________________________________________________________

## 2014-12-02 NOTE — Progress Notes (Signed)
CXR / epic 06/14/2014 EKG/epic 06/14/2014

## 2014-12-03 ENCOUNTER — Ambulatory Visit (HOSPITAL_COMMUNITY): Payer: Federal, State, Local not specified - PPO | Admitting: Anesthesiology

## 2014-12-03 ENCOUNTER — Ambulatory Visit (HOSPITAL_COMMUNITY): Payer: Federal, State, Local not specified - PPO

## 2014-12-03 ENCOUNTER — Encounter (HOSPITAL_COMMUNITY)
Admission: RE | Disposition: A | Discharge status: Home / Self Care | Payer: Self-pay | Source: Ambulatory Visit | Attending: Surgery

## 2014-12-03 ENCOUNTER — Encounter (HOSPITAL_COMMUNITY): Payer: Self-pay | Admitting: *Deleted

## 2014-12-03 ENCOUNTER — Ambulatory Visit (HOSPITAL_COMMUNITY)
Admission: RE | Admit: 2014-12-03 | Discharge: 2014-12-03 | Disposition: A | Discharge status: Home / Self Care | Payer: Federal, State, Local not specified - PPO | Source: Ambulatory Visit | Attending: Surgery | Admitting: Surgery

## 2014-12-03 DIAGNOSIS — Z6841 Body Mass Index (BMI) 40.0 and over, adult: Secondary | ICD-10-CM | POA: Insufficient documentation

## 2014-12-03 DIAGNOSIS — Z9884 Bariatric surgery status: Secondary | ICD-10-CM

## 2014-12-03 DIAGNOSIS — Z79899 Other long term (current) drug therapy: Secondary | ICD-10-CM | POA: Insufficient documentation

## 2014-12-03 DIAGNOSIS — E282 Polycystic ovarian syndrome: Secondary | ICD-10-CM | POA: Insufficient documentation

## 2014-12-03 HISTORY — PX: LAPAROSCOPIC GASTRIC BANDING: SHX1100

## 2014-12-03 SURGERY — GASTRIC BANDING, LAPAROSCOPIC
Anesthesia: General | Site: Abdomen

## 2014-12-03 MED ORDER — ACETAMINOPHEN 160 MG/5ML PO SOLN
325.0000 mg | ORAL | Status: DC | PRN
  Administered 2014-12-03: 650 mg via ORAL
  Filled 2014-12-03 (×2): qty 20.3

## 2014-12-03 MED ORDER — MIDAZOLAM HCL 2 MG/2ML IJ SOLN
INTRAMUSCULAR | Status: AC
  Filled 2014-12-03: qty 4

## 2014-12-03 MED ORDER — MIDAZOLAM HCL 5 MG/5ML IJ SOLN
INTRAMUSCULAR | Status: DC | PRN
  Administered 2014-12-03: 2 mg via INTRAVENOUS

## 2014-12-03 MED ORDER — SCOPOLAMINE 1 MG/3DAYS TD PT72
MEDICATED_PATCH | TRANSDERMAL | Status: AC
  Filled 2014-12-03: qty 1

## 2014-12-03 MED ORDER — LACTATED RINGERS IV SOLN
INTRAVENOUS | Status: DC
Start: 2014-12-03 — End: 2014-12-03
  Administered 2014-12-03: 1000 mL via INTRAVENOUS

## 2014-12-03 MED ORDER — HEPARIN SODIUM (PORCINE) 5000 UNIT/ML IJ SOLN
5000.0000 [IU] | INTRAMUSCULAR | Status: AC
  Administered 2014-12-03: 5000 [IU] via SUBCUTANEOUS
  Filled 2014-12-03: qty 1

## 2014-12-03 MED ORDER — ROCURONIUM BROMIDE 100 MG/10ML IV SOLN
INTRAVENOUS | Status: DC | PRN
  Administered 2014-12-03: 10 mg via INTRAVENOUS
  Administered 2014-12-03: 40 mg via INTRAVENOUS

## 2014-12-03 MED ORDER — NEOSTIGMINE METHYLSULFATE 10 MG/10ML IV SOLN
INTRAVENOUS | Status: DC | PRN
  Administered 2014-12-03: 4 mg via INTRAVENOUS

## 2014-12-03 MED ORDER — DEXTROSE 5 % IV SOLN
INTRAVENOUS | Status: AC
  Filled 2014-12-03: qty 2

## 2014-12-03 MED ORDER — ONDANSETRON HCL 4 MG/2ML IJ SOLN
INTRAMUSCULAR | Status: AC
  Filled 2014-12-03: qty 2

## 2014-12-03 MED ORDER — GLYCOPYRROLATE 0.2 MG/ML IJ SOLN
INTRAMUSCULAR | Status: DC | PRN
  Administered 2014-12-03: .6 mg via INTRAVENOUS

## 2014-12-03 MED ORDER — PROMETHAZINE HCL 25 MG RE SUPP: 25.0000 mg | Freq: Four times a day (QID) | RECTAL | Status: DC | PRN

## 2014-12-03 MED ORDER — 0.9 % SODIUM CHLORIDE (POUR BTL) OPTIME
TOPICAL | Status: DC | PRN
  Administered 2014-12-03: 1000 mL

## 2014-12-03 MED ORDER — BUPIVACAINE LIPOSOME 1.3 % IJ SUSP
INTRAMUSCULAR | Status: DC | PRN
  Administered 2014-12-03: 20 mL

## 2014-12-03 MED ORDER — BUPIVACAINE LIPOSOME 1.3 % IJ SUSP
20.0000 mL | Freq: Once | INTRAMUSCULAR | Status: DC
  Filled 2014-12-03: qty 20

## 2014-12-03 MED ORDER — MORPHINE SULFATE 10 MG/ML IJ SOLN: 2.0000 mg | INTRAMUSCULAR | Status: DC | PRN

## 2014-12-03 MED ORDER — UNJURY VANILLA POWDER: 2.0000 [oz_av] | Freq: Four times a day (QID) | ORAL | Status: DC

## 2014-12-03 MED ORDER — DEXAMETHASONE SODIUM PHOSPHATE 10 MG/ML IJ SOLN
INTRAMUSCULAR | Status: AC
  Filled 2014-12-03: qty 1

## 2014-12-03 MED ORDER — ACETAMINOPHEN 160 MG/5ML PO SOLN
650.0000 mg | ORAL | Status: DC | PRN
  Filled 2014-12-03: qty 20.3

## 2014-12-03 MED ORDER — PROMETHAZINE HCL 25 MG/ML IJ SOLN
INTRAMUSCULAR | Status: AC
  Filled 2014-12-03: qty 1

## 2014-12-03 MED ORDER — UNJURY CHOCOLATE CLASSIC POWDER: 2.0000 [oz_av] | Freq: Four times a day (QID) | ORAL | Status: DC

## 2014-12-03 MED ORDER — LACTATED RINGERS IR SOLN
Status: DC | PRN
  Administered 2014-12-03: 1000 mL

## 2014-12-03 MED ORDER — PROPOFOL 10 MG/ML IV BOLUS
INTRAVENOUS | Status: AC
  Filled 2014-12-03: qty 20

## 2014-12-03 MED ORDER — DEXTROSE 5 % IV SOLN
2.0000 g | INTRAVENOUS | Status: AC
  Administered 2014-12-03: 2 g via INTRAVENOUS

## 2014-12-03 MED ORDER — NEOSTIGMINE METHYLSULFATE 10 MG/10ML IV SOLN
INTRAVENOUS | Status: AC
  Filled 2014-12-03: qty 1

## 2014-12-03 MED ORDER — SODIUM CHLORIDE 0.9 % IJ SOLN
INTRAMUSCULAR | Status: DC | PRN
  Administered 2014-12-03: 5 mL

## 2014-12-03 MED ORDER — FENTANYL CITRATE (PF) 100 MCG/2ML IJ SOLN
INTRAMUSCULAR | Status: DC | PRN
  Administered 2014-12-03 (×2): 50 ug via INTRAVENOUS
  Administered 2014-12-03: 100 ug via INTRAVENOUS
  Administered 2014-12-03 (×6): 50 ug via INTRAVENOUS

## 2014-12-03 MED ORDER — SODIUM CHLORIDE 0.9 % IJ SOLN
INTRAMUSCULAR | Status: AC
  Filled 2014-12-03: qty 10

## 2014-12-03 MED ORDER — SODIUM CHLORIDE 0.9 % IJ SOLN
INTRAMUSCULAR | Status: DC | PRN
  Administered 2014-12-03: 10 mL

## 2014-12-03 MED ORDER — GLYCOPYRROLATE 0.2 MG/ML IJ SOLN
INTRAMUSCULAR | Status: AC
  Filled 2014-12-03: qty 3

## 2014-12-03 MED ORDER — ONDANSETRON HCL 4 MG/2ML IJ SOLN: 4.0000 mg | INTRAMUSCULAR | Status: DC | PRN

## 2014-12-03 MED ORDER — PROMETHAZINE HCL 25 MG/ML IJ SOLN
6.2500 mg | INTRAMUSCULAR | Status: DC | PRN
  Administered 2014-12-03: 6.25 mg via INTRAVENOUS

## 2014-12-03 MED ORDER — MORPHINE SULFATE 10 MG/ML IJ SOLN
1.0000 mg | INTRAMUSCULAR | Status: DC | PRN
  Administered 2014-12-03: 1 mg via INTRAVENOUS
  Administered 2014-12-03 (×2): 2 mg via INTRAVENOUS

## 2014-12-03 MED ORDER — FENTANYL CITRATE (PF) 250 MCG/5ML IJ SOLN
INTRAMUSCULAR | Status: AC
  Filled 2014-12-03: qty 25

## 2014-12-03 MED ORDER — UNJURY CHICKEN SOUP POWDER: 2.0000 [oz_av] | Freq: Four times a day (QID) | ORAL | Status: DC

## 2014-12-03 MED ORDER — OXYCODONE HCL 5 MG/5ML PO SOLN
5.0000 mg | ORAL | Status: DC | PRN
  Administered 2014-12-03: 5 mg via ORAL
  Filled 2014-12-03 (×2): qty 10

## 2014-12-03 MED ORDER — SUCCINYLCHOLINE CHLORIDE 20 MG/ML IJ SOLN
INTRAMUSCULAR | Status: DC | PRN
  Administered 2014-12-03: 180 mg via INTRAVENOUS

## 2014-12-03 MED ORDER — LIDOCAINE HCL (CARDIAC) 20 MG/ML IV SOLN
INTRAVENOUS | Status: AC
  Filled 2014-12-03: qty 5

## 2014-12-03 MED ORDER — ONDANSETRON HCL 4 MG/2ML IJ SOLN
INTRAMUSCULAR | Status: DC | PRN
  Administered 2014-12-03: 4 mg via INTRAVENOUS

## 2014-12-03 MED ORDER — DEXAMETHASONE SODIUM PHOSPHATE 10 MG/ML IJ SOLN
INTRAMUSCULAR | Status: DC | PRN
  Administered 2014-12-03: 10 mg via INTRAVENOUS

## 2014-12-03 MED ORDER — ROCURONIUM BROMIDE 100 MG/10ML IV SOLN
INTRAVENOUS | Status: AC
  Filled 2014-12-03: qty 1

## 2014-12-03 MED ORDER — LIDOCAINE HCL (CARDIAC) 20 MG/ML IV SOLN
INTRAVENOUS | Status: DC | PRN
  Administered 2014-12-03: 50 mg via INTRAVENOUS

## 2014-12-03 MED ORDER — PROPOFOL 10 MG/ML IV BOLUS
INTRAVENOUS | Status: DC | PRN
Start: 2014-12-03 — End: 2014-12-03
  Administered 2014-12-03: 200 mg via INTRAVENOUS

## 2014-12-03 MED ORDER — MORPHINE SULFATE 10 MG/ML IJ SOLN
INTRAMUSCULAR | Status: AC
  Filled 2014-12-03: qty 1

## 2014-12-03 SURGICAL SUPPLY — 54 items
APL SKNCLS STERI-STRIP NONHPOA (GAUZE/BANDAGES/DRESSINGS)
BAND LAP 10.0 W/TUBES (Band) ×2 IMPLANT
BENZOIN TINCTURE PRP APPL 2/3 (GAUZE/BANDAGES/DRESSINGS) IMPLANT
BLADE SURG 15 STRL LF DISP TIS (BLADE) ×1 IMPLANT
BLADE SURG 15 STRL SS (BLADE) ×2
COVER SURGICAL LIGHT HANDLE (MISCELLANEOUS) ×2 IMPLANT
DECANTER SPIKE VIAL GLASS SM (MISCELLANEOUS) ×4 IMPLANT
DEVICE SUT QUICK LOAD TK 5 (STAPLE) ×6 IMPLANT
DEVICE SUT TI-KNOT TK 5X26 (MISCELLANEOUS) ×2 IMPLANT
DEVICE SUTURE ENDOST 10MM (ENDOMECHANICALS) IMPLANT
DISSECTOR BLUNT TIP ENDO 5MM (MISCELLANEOUS) IMPLANT
DRAPE CAMERA CLOSED 9X96 (DRAPES) ×2 IMPLANT
ELECT PENCIL ROCKER SW 15FT (MISCELLANEOUS) ×2 IMPLANT
ELECT REM PT RETURN 9FT ADLT (ELECTROSURGICAL) ×2
ELECTRODE REM PT RTRN 9FT ADLT (ELECTROSURGICAL) ×1 IMPLANT
GAUZE SPONGE 4X4 12PLY STRL (GAUZE/BANDAGES/DRESSINGS) ×2 IMPLANT
GLOVE BIOGEL M 8.0 STRL (GLOVE) ×2 IMPLANT
GOWN SPEC L4 XLG W/TWL (GOWN DISPOSABLE) ×2 IMPLANT
GOWN STRL REUS W/TWL XL LVL3 (GOWN DISPOSABLE) ×6 IMPLANT
HOVERMATT SINGLE USE (MISCELLANEOUS) ×2 IMPLANT
KIT BASIN OR (CUSTOM PROCEDURE TRAY) ×2 IMPLANT
LIQUID BAND (GAUZE/BANDAGES/DRESSINGS) ×2 IMPLANT
MESH HERNIA 1X4 RECT BARD (Mesh General) ×1 IMPLANT
MESH HERNIA BARD 1X4 (Mesh General) ×1 IMPLANT
NEEDLE SPNL 22GX3.5 QUINCKE BK (NEEDLE) ×2 IMPLANT
PACK UNIVERSAL I (CUSTOM PROCEDURE TRAY) ×2 IMPLANT
SCISSORS LAP 5X45 EPIX DISP (ENDOMECHANICALS) IMPLANT
SET IRRIG TUBING LAPAROSCOPIC (IRRIGATION / IRRIGATOR) IMPLANT
SHEARS CURVED HARMONIC AC 45CM (MISCELLANEOUS) IMPLANT
SLEEVE ADV FIXATION 5X100MM (TROCAR) ×2 IMPLANT
SOLUTION ANTI FOG 6CC (MISCELLANEOUS) ×2 IMPLANT
SPONGE LAP 18X18 X RAY DECT (DISPOSABLE) ×2 IMPLANT
STAPLER VISISTAT 35W (STAPLE) ×2 IMPLANT
STRIP CLOSURE SKIN 1/2X4 (GAUZE/BANDAGES/DRESSINGS) IMPLANT
SUT ETHIBOND 2 0 SH (SUTURE) ×6
SUT ETHIBOND 2 0 SH 36X2 (SUTURE) ×3 IMPLANT
SUT PROLENE 2 0 CT2 30 (SUTURE) ×2 IMPLANT
SUT SILK 0 (SUTURE) ×2
SUT SILK 0 30XBRD TIE 6 (SUTURE) ×1 IMPLANT
SUT SURGIDAC NAB ES-9 0 48 120 (SUTURE) IMPLANT
SUT VIC AB 2-0 SH 27 (SUTURE) ×2
SUT VIC AB 2-0 SH 27X BRD (SUTURE) ×1 IMPLANT
SUT VIC AB 4-0 SH 18 (SUTURE) ×2 IMPLANT
SYR 20CC LL (SYRINGE) ×4 IMPLANT
TOWEL OR 17X26 10 PK STRL BLUE (TOWEL DISPOSABLE) ×4 IMPLANT
TOWEL OR NON WOVEN STRL DISP B (DISPOSABLE) ×2 IMPLANT
TROCAR ADV FIXATION 12X100MM (TROCAR) ×2 IMPLANT
TROCAR ADV FIXATION 5X100MM (TROCAR) ×2 IMPLANT
TROCAR BLADELESS 15MM (ENDOMECHANICALS) ×2 IMPLANT
TROCAR BLADELESS OPT 5 100 (ENDOMECHANICALS) ×2 IMPLANT
TROCAR XCEL NON-BLD 11X100MML (ENDOMECHANICALS) IMPLANT
TROCAR XCEL UNIV SLVE 11M 100M (ENDOMECHANICALS) IMPLANT
TUBE CALIBRATION LAPBAND (TUBING) ×2 IMPLANT
TUBING INSUFFLATION 10FT LAP (TUBING) ×2 IMPLANT

## 2014-12-03 NOTE — Anesthesia Preprocedure Evaluation (Signed)
Anesthesia Evaluation  Patient identified by MRN, date of birth, ID band Patient awake    Reviewed: Allergy & Precautions, NPO status , Patient's Chart, lab work & pertinent test results  History of Anesthesia Complications (+) PONV and history of anesthetic complications  Airway Mallampati: II  TM Distance: >3 FB Neck ROM: Full    Dental no notable dental hx.    Pulmonary neg pulmonary ROS,  breath sounds clear to auscultation  Pulmonary exam normal       Cardiovascular negative cardio ROS Normal cardiovascular examRhythm:Regular Rate:Normal     Neuro/Psych negative neurological ROS  negative psych ROS   GI/Hepatic negative GI ROS, Neg liver ROS,   Endo/Other  Morbid obesity  Renal/GU negative Renal ROS  negative genitourinary   Musculoskeletal negative musculoskeletal ROS (+)   Abdominal (+) + obese,   Peds negative pediatric ROS (+)  Hematology negative hematology ROS (+)   Anesthesia Other Findings   Reproductive/Obstetrics negative OB ROS                             Anesthesia Physical Anesthesia Plan  ASA: III  Anesthesia Plan: General   Post-op Pain Management:    Induction: Intravenous  Airway Management Planned:   Additional Equipment:   Intra-op Plan:   Post-operative Plan: Extubation in OR  Informed Consent: I have reviewed the patients History and Physical, chart, labs and discussed the procedure including the risks, benefits and alternatives for the proposed anesthesia with the patient or authorized representative who has indicated his/her understanding and acceptance.   Dental advisory given  Plan Discussed with: CRNA  Anesthesia Plan Comments:         Anesthesia Quick Evaluation

## 2014-12-03 NOTE — Progress Notes (Signed)
Patient is alert and oriented.  Pain is controlled, and patient is tolerating fluids.  Plan to advance to protein shake tomorrow.  Reviewed Adjustable gastric band discharge instructions with patient, patient able to articulate understanding.  Provided information on BELT program, Support Group and [facility] outpatient pharmacy. All questions answered, will continue to monitor.  

## 2014-12-03 NOTE — Discharge Instructions (Signed)
Laparoscopic Gastric Band Surgery, Care After These instructions give you information on caring for yourself after your procedure. Your doctor may also give you more specific instructions. Call your doctor if you have any problems or questions after your procedure. HOME CARE   Take walks throughout the day. Do not sit for longer than 1 hour while awake for 4 to 6 weeks.  You may shower 2 days after surgery. Pat the surgery cuts (incisions) dry. Do not rub the surgery cuts.  Do your coughing and deep breathing exercises.  Do not lift, push, or pull anything heavy until your doctor says it is okay.  Only take medicines as told by your doctor. Do not drive while taking pain medicine.  Drink plenty of fluids to keep your pee (urine) clear or pale yellow.  Stay on a liquid diet as long as your doctor tells you to.  Do not drink caffeine for 1 month.  Change bandages (dressings) as told by your doctor.  Check your surgery cuts for redness, puffiness (swelling), abnormal coloring, fluid, or bleeding.  Follow your doctor's advice about vitamin and protein needs after surgery. GET HELP RIGHT AWAY IF:  You feel sick to your stomach (nauseous) and throw up (vomit).  You have pain and discomfort with swallowing.  You develop shortness of breath or difficulty breathing.  You have pain, puffiness, or feel warmth on your lower body.  You have very bad calf pain or pain not relieved by medicine.  You have a temperature by mouth above 102 F (38.9 C).  Your surgery cuts look red, puffy, or they leak fluid.  Your poop (stool) is black, tarry, or dark red.  You have chills.  You have chest pain.  You feel confused.  You have slurred speech.  You feel light-headed when standing.  You suddenly feel weak.  You have any questions or concerns. MAKE SURE YOU:   Understand these instructions.  Will watch your condition.  Will get help right away if you are not doing well or get  worse. Document Released: 05/15/2010 Document Revised: 08/27/2013 Document Reviewed: 05/15/2010 [facility] Patient Information 2015 [place], [place]. This information is not intended to replace advice given to you by your health care provider. Make sure you discuss any questions you have with your health care provider.       General Anesthesia, Care After Refer to this sheet in the next few weeks. These instructions provide you with information on caring for yourself after your procedure. Your health care provider may also give you more specific instructions. Your treatment has been planned according to current medical practices, but problems sometimes occur. Call your health care provider if you have any problems or questions after your procedure. WHAT TO EXPECT AFTER THE PROCEDURE After the procedure, it is typical to experience:  Sleepiness.  Nausea and vomiting. HOME CARE INSTRUCTIONS  For the first 24 hours after general anesthesia:  Have a responsible person with you.  Do not drive a car. If you are alone, do not take public transportation.  Do not drink alcohol.  Do not take medicine that has not been prescribed by your health care provider.  Do not sign important papers or make important decisions.  You may resume a normal diet and activities as directed by your health care provider.  Change bandages (dressings) as directed.  If you have questions or problems that seem related to general anesthesia, call the hospital and ask for the anesthetist or anesthesiologist on call. SEEK  MEDICAL CARE IF:  You have nausea and vomiting that continue the day after anesthesia.  You develop a rash. SEEK IMMEDIATE MEDICAL CARE IF:   You have difficulty breathing.  You have chest pain.  You have any allergic problems. Document Released: 07/19/2000 Document Revised: 04/17/2013 Document Reviewed: 10/26/2012 [facility] Patient Information 2015 [place], [place]. This information is not  intended to replace advice given to you by your health care provider. Make sure you discuss any questions you have with your health care provider.                    ADJUSTABLE GASTRIC BAND  Home Care Instructions   These instructions are to help you care for yourself when you go home.  Call: If you have any problems.  Call [phone] and ask for the surgeon on call  If you need immediate assistance come to the ER at [facility]. Tell the ER staff you are a new post-op gastric banding patient  Signs and symptoms to report:  Severe  vomiting or nausea o If you cannot handle clear liquids for longer than 1 day, call your surgeon  Abdominal pain which does not get better after taking your pain medication  Fever greater than 100.4  F and chills  Heart rate over 100 beats a minute  Trouble breathing  Chest pain  Redness,  swelling, drainage, or foul odor at incision (surgical) sites  If your incisions open or pull apart  Swelling or pain in calf (lower leg)  Diarrhea (Loose bowel movements that happen often), frequent watery, uncontrolled bowel movements  Constipation, (no bowel movements for 3 days) if this happens: o Take Milk of Magnesia, 2 tablespoons by mouth, 3 times a day for 2 days if needed o Stop taking Milk of Magnesia once you have had a bowel movement o Call your doctor if constipation continues Or o Take Miralax  (instead of Milk of Magnesia) following the label instructions o Stop taking Miralax once you have had a bowel movement o Call your doctor if constipation continues  Anything you think is abnormal for you   Normal side effects after surgery:  Unable to sleep at night or unable to concentrate  Irritability  Being tearful (crying) or depressed  These are common complaints, possibly related to your anesthesia, stress of surgery, and change in lifestyle, that usually go away a few weeks after surgery. If these feelings continue, call your  medical doctor.  Wound Care: You may have surgical glue, steri-strips, or staples over your incisions after surgery  Surgical glue: Looks like clear film over your incisions and will wear off a little at a time  Steri-strips: Adhesive strips of tape over your incisions. You may notice a yellowish color on skin under the steri-strips. This is used to make the steri-strips stick better. Do not pull the steri-strips off - let them fall off  Staples: Staples may be removed before you leave the hospital o If you go home with staples, call [facility] for an appointment with your surgeons nurse to have staples removed 10 days after surgery, [phone]  Showering: You may shower two (2) days after your surgery unless your surgeon tells you differently o Wash gently around incisions with warm soapy water, rinse well, and gently pat dry o If you have a drain (tube from your incision), you may need someone to hold this while you shower o No tub baths until staples are removed and incisions are  healed   Medications:  Medications should be liquid or crushed if larger than the size of a dime  Extended release pills (medication that releases a little bit at a time through the  day) should not be crushed  Depending on the size and number of medications you take, you may need to space (take a few throughout the day)/change the time you take your medications so that you do not over-fill your pouch (smaller stomach)  Make sure you follow-up with you primary care physician to make medication changes needed during rapid weight loss and life -style changes  If you have diabetes, follow up with your doctor that orders your diabetes medication(s) within one week after surgery and check your blood sugar regularly   Do not drive while taking narcotics (pain medications)   Do not take acetaminophen (Tylenol) and Roxicet or Lortab Elixir at the same time since these pain medications contain  acetaminophen   Diet:  First 2 Weeks You will see the nutritionist about two (2) weeks after your surgery. The nutritionist will increase the types of foods you can eat if you are handling liquids well:  If you have severe vomiting or nausea and cannot handle clear liquids lasting longer than 1 day call your surgeon For Same Day Surgery Discharge Patients:  The day of surgery drink water only: 2 ounces every 4 hours  If you are handling water, start drinking your high protein shake the next morning For Overnight Stay Patients:  Begin by drinking 2 ounces of a high protein every 3 hours, 5-6 times per day  Slowly increase the amount you drink as tolerated  You may find it easier to slowly sip shakes throughout the day. It is important to get your proteins in first    Protein Shake  Drink at least 2 ounces of shake 5-6 times per day  Each serving of protein shakes (usually 8-12 ounces) should have a minimum of: o 15 grams of protein o And no more than 5 grams of carbohydrate  Goal for protein each day: o Men = 80 grams per day o Women = 60 grams per day  Protein powder may be added to fluids such as non-fat milk or Lactaid milk or Soy milk (limit to 35 grams added protein powder per serving)  Hydration  Slowly increase the amount of water and other clear liquids as tolerated (See Acceptable Fluids)  Slowly increase the amount of protein shake as tolerated  Sip fluids slowly and throughout the day  May use sugar substitutes in small amounts (no more than 6-8 packets per day; i.e. Splenda)  Fluid Goal  The first goal is to drink at least 8 ounces of protein shake/drink per day (or as directed by the nutritionist); some examples of protein shakes are Syntrax, Nectar, Dillard's, EAS Edge HP, and Unjury. - See handout from pre-op Bariatric Education Class: o Slowly increase the amount of protein shake you drink as tolerated o You may find it easier to slowly sip shakes  throughout the day o It is important to get your proteins in first  Your fluid goal is to drink 64-100 ounces of fluid daily o It may take a few weeks to build up to this   32 oz. (or more) should be full liquids (see below for examples)  Liquids should not contain sugar, caffeine, or carbonation  Clear Liquids:  Water of Sugar-free flavored water (i.e. Fruit HO, Propel)  Decaffeinated coffee or tea (sugar-free)  Crystal  lite, Wylers Lite, Minute Maid Lite  Sugar-free Jell-O  Bouillon or broth  Sugar-free Popsicle:    - Less than 20 calories each; Limit 1 per day        Full Liquids:                   Protein Shakes/Drinks + 2 choices per day of other full liquids  Full liquids must be: o No More Than 12 grams of Carbs per serving o No More Than 3 grams of Fat per serving  Strained low-fat cream soup  Non-Fat milk  Fat-free Lactaid Milk  Sugar-free yogurt (Dannon Lite & Fit, Greek yogurt)   Vitamins and Minerals  Start 1 day after surgery unless otherwise directed by your surgeon  1 Chewable Multivitamin / Multimineral Supplement with iron (i.e. Centrum for Adults)  Chewable Calcium Citrate with Vitamin D-3 (Example: 3 Chewable Calcium  Plus 600 with vitamin D-3) o Take 500 mg three (3) times a day for a total of 1500 mg per day o Do not take all 3 doses of calcium at one time as it may cause constipation, and you can only absorb 500 mg at a time o Do not mix multivitamins containing iron with calcium supplements;  take 2 hours apart o Do not substitute Tums (calcium carbonate) for your calcium  Menstruating women and those at risk for anemia ( a blood disease that causes weakness) may need extra iron o Talk to your doctor to see if you need more iron  If you need extra iron: total daily iron recommendation (including Vitamins) is 50 to 100 mg Iron/day  Do not stop taking or change any vitamins or minerals until you talk to your nutritionist or  surgeon  Your nutritionist and/or surgeon must approve all vitamin and mineral supplements  Activity and Exercise: It is important to continue walking at home. Limit your physical activity as instructed by your doctor. During this time, use these guidelines:  Do not lift anything greater than ten  (10) pounds for at least two (2) weeks  Do not go back to work or drive until Designer, industrial/product says you can  You may have sex when you feel comfortable o It is VERY important for female patients to use a reliable birth control method; fertility often increase after surgery o Do not get pregnant for at least 18 months  Start exercising as soon as your doctor tells you that you can o Make sure your doctor approves any physical activity  Start with a simple walking program  Walk 5-15 minutes each day, 7 days per week  Slowly increase until you are walking 30-45 minutes per day  Consider joining our BELT program. [phone] or email belt@[employer] .edu    Special Instructions  Things to remember:  Free counseling is available for you and your family through collaboration between [facility] and [facility]. Please call [phone] and leave a message  Use your CPAP when sleeping if this applies to you  Consider buying a medical alert bracelet that says you had lap-band surgery  You will likely have your first fill (fluid added to your band) 6 - 8 weeks after surgery  [facility] has a free Bariatric Surgery Support Group that meets monthly, the 3rd Thursday, 6pm. Calvert Cantor. You can see classes online at [email or web address]  It is very important to keep all follow up appointments with your surgeon, nutritionist, primary care physician, and behavioral health practitioner o After  the first year, please follow up with your bariatric surgeon and nutritionist at least once a year in order to maintain best weight loss results                    [facility]:  [phone]               [facility]: [phone]               Bariatric Nurse Coordinator: [phone]      Adjustable Gastric Band Home Care Instructions  Rev. 05/2012                                                                  Reviewed and Endorsed                                                   by [facility] Patient Education Committee, Jan, 2014

## 2014-12-03 NOTE — Op Note (Signed)
12/03/2014  Surgeon: Wenda Low, MD, FACS Asst:  Feliciana Rossetti, MD  Procedure: Laparoscopic adjustable gastric banding with Apollo APS Lapband  Anes:  General  EBL:  Minimal  Description of Procedure  The patient was taken to OR # 4 and given general anesthesia.  After a prep with PCMX the patient was draped and a timeout performed.  Access to the abdomen was achieved with a 0 degree Optiview technique through the left upper quadrant.    Adhesions were minimal.  Ports were placed to the the right of the midline including a 15 trocar in  the right upper quadrant placed obliquely.  The Satira Mccallum was used to retract the left lateral segment and the peritoneum was incised along the left crus.   The EJ junction as assessed for a hiatus hernia and none was seen.  A balloon test was not performed.  UGI was negative.    The pars flaccida technique was utilized to insert the blunt "finger " dissector from right to left behind the stomach.  This created a target zone to pass the band passer.     The lapband APS  Had been previously flushed and was inserted through the 15 trocar.  It was placed in the tip of "the finger"  and pulled around behind the stomach.   The band was plicated with three free sutures of Surgidek secured with Ty-Knots.  The tubing was brought out through the lower incision on the right and connected to the port which had mesh sewn onto the back and was placed into the subcutaneous pocket.  The incisions were injected with Exparel and closed with 4-0 vicryl and Liquiban.     The patient was taken to the PACU in stable condition.    Matt B. Daphine Deutscher, MD, [facility], [place] [phone]

## 2014-12-03 NOTE — Progress Notes (Addendum)
Back to short stay from PACU to go home today. Up to bathroom. Voided. Tolerated well. Back to recliner chair. Incentive spirometer up to . Called laurie Deaton, bariatric nurse coordinator, who will see patient before she goes home. Pain level is a 4. She states she is just sore but does not want any pain medications at this time.  1450   Ambulated entire length of hallway on [address]. Tolerated well.

## 2014-12-03 NOTE — Transfer of Care (Signed)
Immediate Anesthesia Transfer of Care Note  Patient: Kelsey Gonzales  Procedure(s) Performed: Procedure(s): LAPAROSCOPIC GASTRIC BANDING (N/A)  Patient Location: PACU  Anesthesia Type:General  Level of Consciousness:  sedated, patient cooperative and responds to stimulation  Airway & Oxygen Therapy:Patient Spontanous Breathing and Patient connected to face mask oxgen  Post-op Assessment:  Report given to PACU RN and Post -op Vital signs reviewed and stable  Post vital signs:  Reviewed and stable  Last Vitals:  Filed Vitals:   12/03/14 0801  BP: 125/74  Pulse: 114  Temp: 36.6 C  Resp: 20    Complications: No apparent anesthesia complications

## 2014-12-03 NOTE — Anesthesia Postprocedure Evaluation (Signed)
  Anesthesia Post-op Note  Patient: Kelsey Gonzales  Procedure(s) Performed: Procedure(s) (LRB): LAPAROSCOPIC GASTRIC BANDING (N/A)  Patient Location: PACU  Anesthesia Type: General  Level of Consciousness: awake and alert   Airway and Oxygen Therapy: Patient Spontanous Breathing  Post-op Pain: mild  Post-op Assessment: Post-op Vital signs reviewed, Patient's Cardiovascular Status Stable, Respiratory Function Stable, Patent Airway and No signs of Nausea or vomiting  Last Vitals:  Filed Vitals:   12/03/14 1600  BP: 111/54  Pulse:   Temp: 36.4 C  Resp: 16    Post-op Vital Signs: stable   Complications: No apparent anesthesia complications

## 2014-12-03 NOTE — Interval H&P Note (Signed)
History and Physical Interval Note:  12/03/2014 9:47 AM  Linda B Macneill  has presented today for surgery, with the diagnosis of Morbid Obesity  The various methods of treatment have been discussed with the patient and family. After consideration of risks, benefits and other options for treatment, the patient has consented to  Procedure(s): LAPAROSCOPIC GASTRIC BANDING (N/A) as a surgical intervention .  The patient's history has been reviewed, patient examined, no change in status, stable for surgery.  I have reviewed the patient's chart and labs.  Questions were answered to the patient's satisfaction.     Sperry,Rodriquez Thorner B

## 2014-12-03 NOTE — Anesthesia Procedure Notes (Signed)
Procedure Name: Intubation Date/Time: 12/03/2014 10:16 AM Performed by: Paris Lore Pre-anesthesia Checklist: Patient identified, Emergency Drugs available, Suction available, Patient being monitored and Timeout performed Patient Re-evaluated:Patient Re-evaluated prior to inductionOxygen Delivery Method: Circle system utilized Preoxygenation: Pre-oxygenation with 100% oxygen Intubation Type: IV induction Ventilation: Mask ventilation without difficulty Laryngoscope Size: Mac and 4 Grade View: Grade I Tube type: Oral Tube size: 7.5 mm Number of attempts: 1 Airway Equipment and Method: Stylet Placement Confirmation: ETT inserted through vocal cords under direct vision,  positive ETCO2,  CO2 detector and breath sounds checked- equal and bilateral Secured at: 20 cm Tube secured with: Tape Dental Injury: Teeth and Oropharynx as per pre-operative assessment

## 2014-12-03 NOTE — H&P (View-Only) (Signed)
Chief Complaint:  Morbid obesity and PCOS  History of Present Illness:  Kelsey Gonzales is an 29 y.o. female whose mother, Allyne Gee, has been very successful with Lapband.  She was seen in the office and we discussed sleeve gastrectomy and Lapband and she has decided on lapband placement.  All of her questions have been answered.  She had her hysterctomy back in January and did well  She is ready for Lapband surgery on August 16.    Past Medical History  Diagnosis Date  . Abnormal Pap smear   . Hx of varicella   . Elevated antinuclear antibody (ANA) level 10/01/2013    Past Surgical History  Procedure Laterality Date  . No past surgeries    . Myringotomy    . Tube in ears    . Adenoidectomy    . Abdominal hysterectomy      Current Outpatient Prescriptions  Medication Sig Dispense Refill  . Ascorbic Acid (VITAMIN C) 100 MG tablet Take 100 mg by mouth daily.    . ergocalciferol (VITAMIN D2) 50000 UNITS capsule Take 50,000 Units by mouth once a week.    Marland Kitchen ibuprofen (ADVIL,MOTRIN) 600 MG tablet Take 1 tablet (600 mg total) by mouth every 6 (six) hours. (Patient not taking: Reported on 07/02/2014) 30 tablet 1  . Vitamin D, Cholecalciferol, 1000 UNITS TABS Take by mouth.     No current facility-administered medications for this visit.   Chlorhexidine gluconate Family History  Problem Relation Age of Onset  . Other Neg Hx   . Esophageal cancer Neg Hx   . Rectal cancer Neg Hx   . Stomach cancer Neg Hx   . Hypertension Father   . Kidney Stones Father   . Migraines Father   . Depression Father   . Muscular dystrophy Father   . Heart disease Maternal Grandmother   . Hypertension Maternal Grandmother   . Cancer Maternal Grandmother     kidney  . Depression Maternal Grandmother   . Heart disease Maternal Grandfather   . Hypertension Maternal Grandfather   . Depression Maternal Grandfather   . Heart disease Paternal Grandmother   . Hypertension Paternal Grandmother   . Depression  Paternal Grandmother   . Muscular dystrophy Paternal Grandmother   . Heart disease Paternal Grandfather   . Hypertension Paternal Grandfather   . Cancer Paternal Grandfather     colon, leaukemia  . Colon cancer Paternal Grandfather    Social History:   reports that she has never smoked. She has never used smokeless tobacco. She reports that she does not drink alcohol or use illicit drugs.   REVIEW OF SYSTEMS : Negative except for see problem list  Physical Exam:   Last menstrual period 08/18/2013. There is no weight on file to calculate BMI.  Gen:  WDWN WF NAD  Neurological: Alert and oriented to person, place, and time. Motor and sensory function is grossly intact  Head: Normocephalic and atraumatic.  Eyes: Conjunctivae are normal. Pupils are equal, round, and reactive to light. No scleral icterus.  Neck: Normal range of motion. Neck supple. No tracheal deviation or thyromegaly present.  Cardiovascular:  SR without murmurs or gallops.  No carotid bruits Breast:  Not examined Respiratory: Effort normal.  No respiratory distress. No chest wall tenderness. Breath sounds normal.  No wheezes, rales or rhonchi.  Abdomen:  Obese and nontender GU:  Not examined Musculoskeletal: Normal range of motion. Extremities are nontender. No cyanosis, edema or clubbing noted Lymphadenopathy: No cervical, preauricular,  postauricular or axillary adenopathy is present Skin: Skin is warm and dry. No rash noted. No diaphoresis. No erythema. No pallor. Pscyh: Normal mood and affect. Behavior is normal. Judgment and thought content normal.   LABORATORY RESULTS: No results found for this or any previous visit (from the past 48 hour(s)).   RADIOLOGY RESULTS: No results found.  Problem List: Patient Active Problem List   Diagnosis Date Noted  . Elevated antinuclear antibody (ANA) level 10/01/2013  . 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 Recurrent epistaxis 09/27/2013  . Joint pain 09/27/2013     Assessment & Plan: Morbid obesity and PCOS-  Lapband surgery     Matt B. Daphine Deutscher, MD, [facility] [phone] beeper [phone]  11/28/2014 10:32 AM

## 2014-12-04 ENCOUNTER — Encounter (HOSPITAL_COMMUNITY): Payer: Self-pay | Admitting: Surgery

## 2014-12-09 ENCOUNTER — Telehealth (HOSPITAL_COMMUNITY): Payer: Self-pay

## 2014-12-09 NOTE — Telephone Encounter (Signed)
Made discharge phone call to patient per DROP protocol. Asking the following questions.    1. Do you have someone to care for you now that you are home?  yes 2. Are you having pain now that is not relieved by your pain medication?  no 3. Are you able to drink the recommended daily amount of fluids (48 ounces minimum/day) and protein (60-80 grams/day) as prescribed by the dietitian or nutritional counselor?  yes 4. Are you taking the vitamins and minerals as prescribed?  yes 5. Do you have the "on call" number to contact your surgeon if you have a problem or question?  yes 6. Are your incisions free of redness, swelling or drainage? (If steri strips, address that these can fall off, shower as tolerated) yes 7. Have your bowels moved since your surgery?  If not, are you passing gas?  yes 8. Are you up and walking 3-4 times per day?  yes    1. Do you have an appointment made to see your surgeon in the next month?  yes 2. Were you provided your discharge medications before your surgery or before you were discharged from the hospital and are you taking them without problem?  yes 3. Were you provided phone numbers to the clinic/surgeon's office?  yes 4. Did you watch the patient education video module in the (clinic, surgeon's office, etc.) before your surgery? no 5. Do you have a discharge checklist that was provided to you in the hospital to reference with instructions on how to take care of yourself after surgery?  yes 6. Did you see a dietitian or nutritional counselor while you were in the hospital?  yes 7. Do you have an appointment to see a dietitian or nutritional counselor in the next month?  yes   

## 2014-12-17 ENCOUNTER — Encounter: Payer: Federal, State, Local not specified - PPO | Attending: Surgery

## 2014-12-17 DIAGNOSIS — Z713 Dietary counseling and surveillance: Secondary | ICD-10-CM | POA: Insufficient documentation

## 2014-12-17 DIAGNOSIS — Z6841 Body Mass Index (BMI) 40.0 and over, adult: Secondary | ICD-10-CM | POA: Diagnosis not present

## 2014-12-17 NOTE — Progress Notes (Signed)
Bariatric Class:  Appt start time: 1530 end time:  1630.  2 Week Post-Operative Nutrition Class  Patient was seen on 12/17/2014 for Post-Operative Nutrition education at the [facility].   Surgery date: 12/03/14 Surgery type: LAGB Start weight at [facility]: 239 lbs on 06/01/14, 249 lbs on 11/18/14 Weight today: 234.5 lbs  Weight change: 14.5 lbs  TANITA  BODY COMP RESULTS  11/18/14 12/17/14   BMI (kg/m^2) 42.7 40.3   Fat Mass (lbs) 126.5 117.0   Fat Free Mass (lbs) 122.5 117.5   Total Body Water (lbs) 89.5 86.0    The following the learning objectives were met by the patient during this course:  Identifies Phase 3A (Soft, High Proteins) Dietary Goals and will begin from 2 weeks post-operatively to 2 months post-operatively  Identifies appropriate sources of fluids and proteins   States protein recommendations and appropriate sources post-operatively  Identifies the need for appropriate texture modifications, mastication, and bite sizes when consuming solids  Identifies appropriate multivitamin and calcium sources post-operatively  Describes the need for physical activity post-operatively and will follow MD recommendations  States when to call healthcare provider regarding medication questions or post-operative complications  Handouts given during class include:  Phase 3A: Soft, High Protein Diet Handout  Follow-Up Plan: Patient will follow-up at [facility] in 6 weeks for 2 month post-op nutrition visit for diet advancement per MD.

## 2014-12-24 ENCOUNTER — Ambulatory Visit: Payer: Federal, State, Local not specified - PPO

## 2015-01-30 ENCOUNTER — Ambulatory Visit: Payer: Federal, State, Local not specified - PPO | Admitting: Dietician

## 2016-02-09 DIAGNOSIS — R102 Pelvic and perineal pain: Secondary | ICD-10-CM | POA: Diagnosis not present

## 2016-02-26 DIAGNOSIS — F411 Generalized anxiety disorder: Secondary | ICD-10-CM | POA: Diagnosis not present

## 2016-02-26 DIAGNOSIS — R413 Other amnesia: Secondary | ICD-10-CM | POA: Diagnosis not present

## 2016-02-26 DIAGNOSIS — Z0000 Encounter for general adult medical examination without abnormal findings: Secondary | ICD-10-CM | POA: Diagnosis not present

## 2016-02-26 DIAGNOSIS — D802 Selective deficiency of immunoglobulin A [IgA]: Secondary | ICD-10-CM | POA: Diagnosis not present

## 2016-03-09 DIAGNOSIS — N83299 Other ovarian cyst, unspecified side: Secondary | ICD-10-CM | POA: Diagnosis not present

## 2016-04-02 ENCOUNTER — Ambulatory Visit: Payer: Federal, State, Local not specified - PPO | Admitting: Neurology

## 2016-05-31 DIAGNOSIS — K080 Exfoliation of teeth due to systemic causes: Secondary | ICD-10-CM | POA: Diagnosis not present

## 2016-06-06 IMAGING — DX DG UGI W/ KUB
2 series · 2 of 2 positions shown · non-contrast
Comparison: None.

CLINICAL DATA: Morbid obesity. Pre-op evaluation for bariatric
surgery.

EXAM:
UPPER GI SERIES WITH KUB
TECHNIQUE: After obtaining a scout radiograph a routine upper GI series was
performed using thin barium.
FLUOROSCOPY TIME:  Fluoroscopy Time (in minutes and seconds): 1
minutes 35 seconds
Number of Acquired Images:  13

[abdomen kub (1 of 2)]
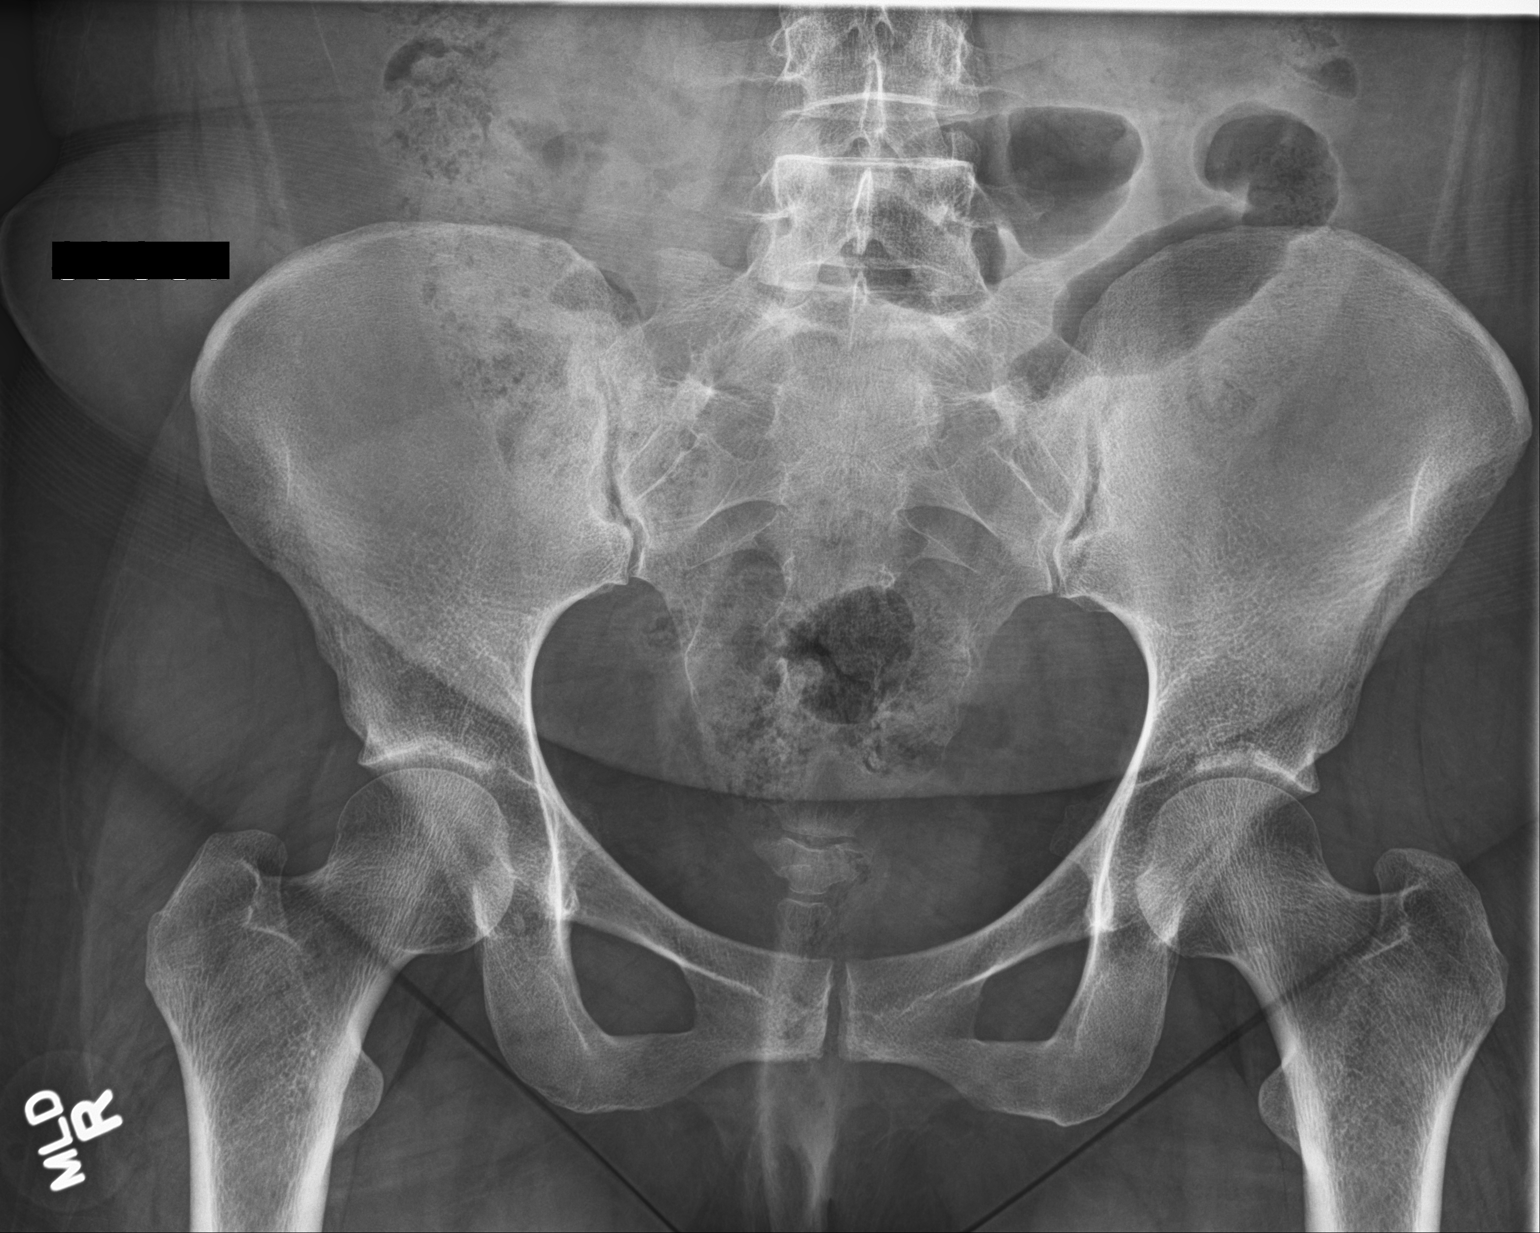

[abdomen kub (2 of 2)]
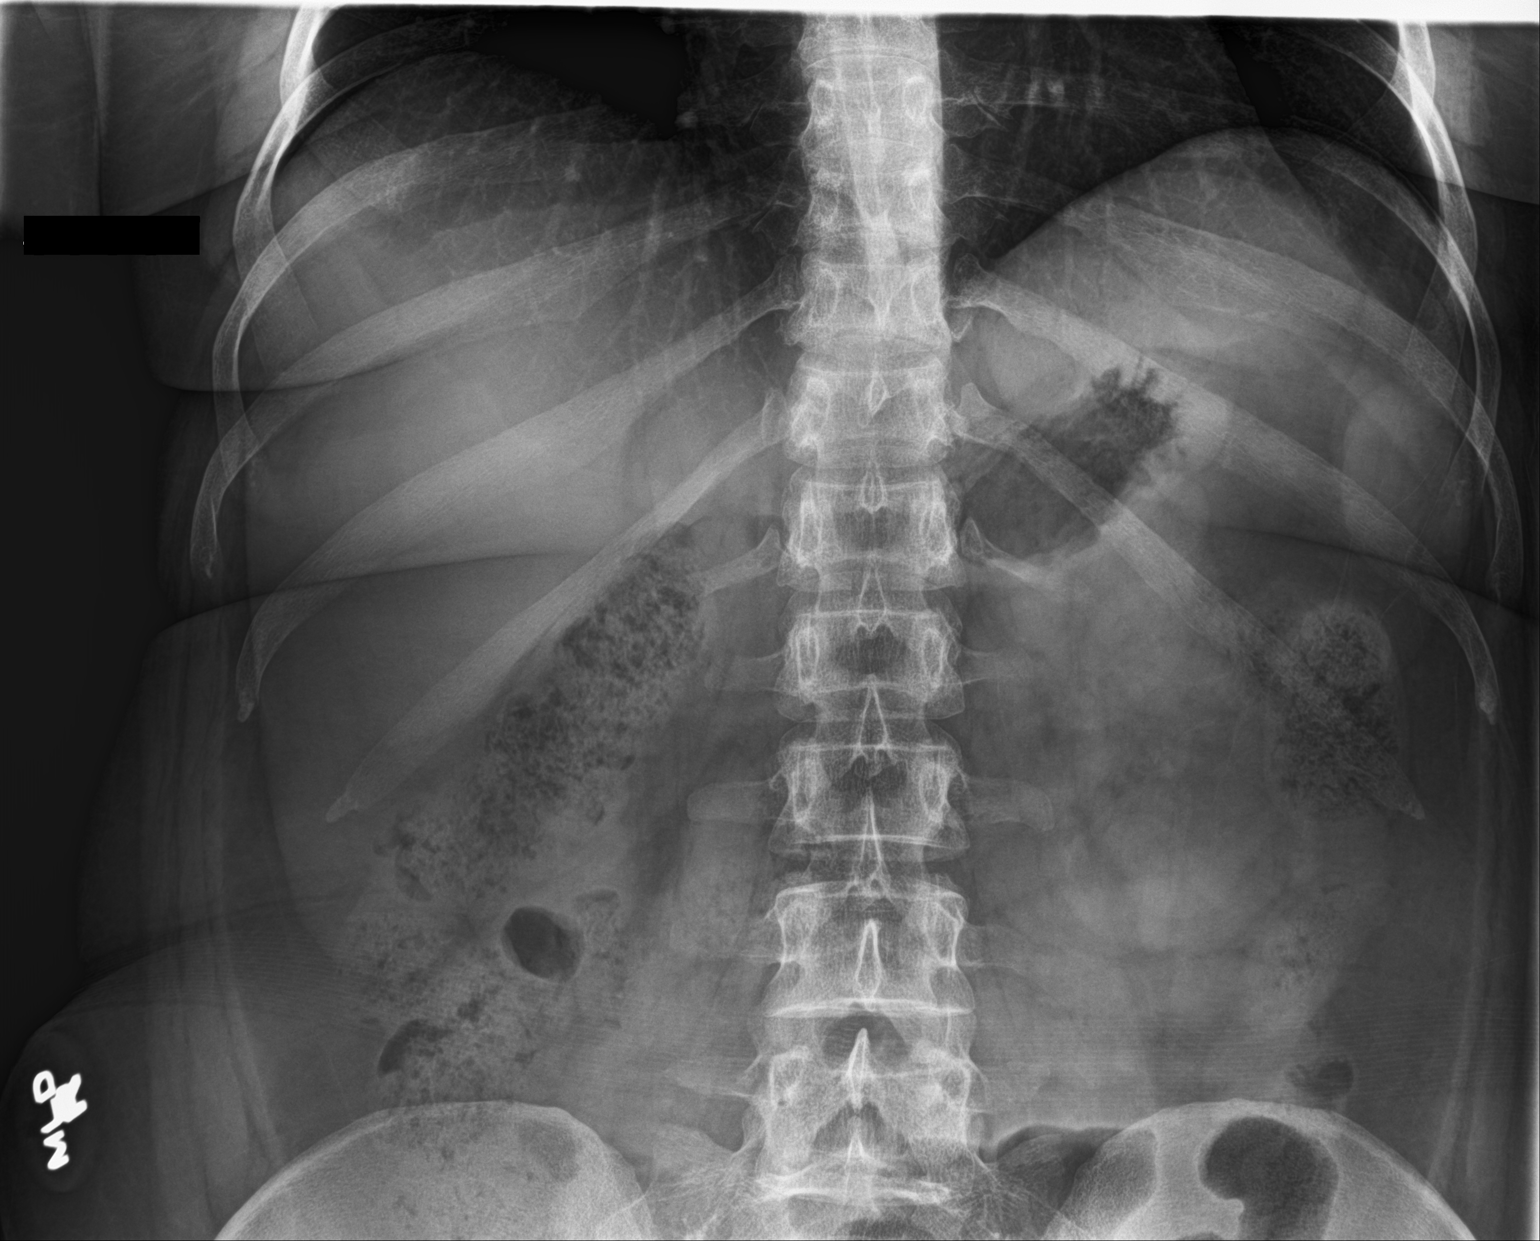

[2 of 2 positions shown; findings below may reference images not displayed]

FINDINGS: Scout radiograph:  Unremarkable bowel gas pattern.

Esophagus: No evidence of esophageal mass or stricture. Motility is
within normal limits. No gastroesophageal reflux observed.

Stomach: No hiatal hernia visualized. No evidence of gastric mass or
ulcer.

Duodenum: No ulcer or other significant abnormality seen involving
duodenal bulb or sweep.

Other:  None.
IMPRESSION: Normal upper GI series.

## 2016-06-06 IMAGING — DX DG CHEST 2V
2 series · 2 of 2 positions shown · non-contrast
Comparison: None.

CLINICAL DATA: Morbid obesity. Pre-op evaluation for bariatric
surgery.

EXAM:
CHEST  2 VIEW

[chest pa]
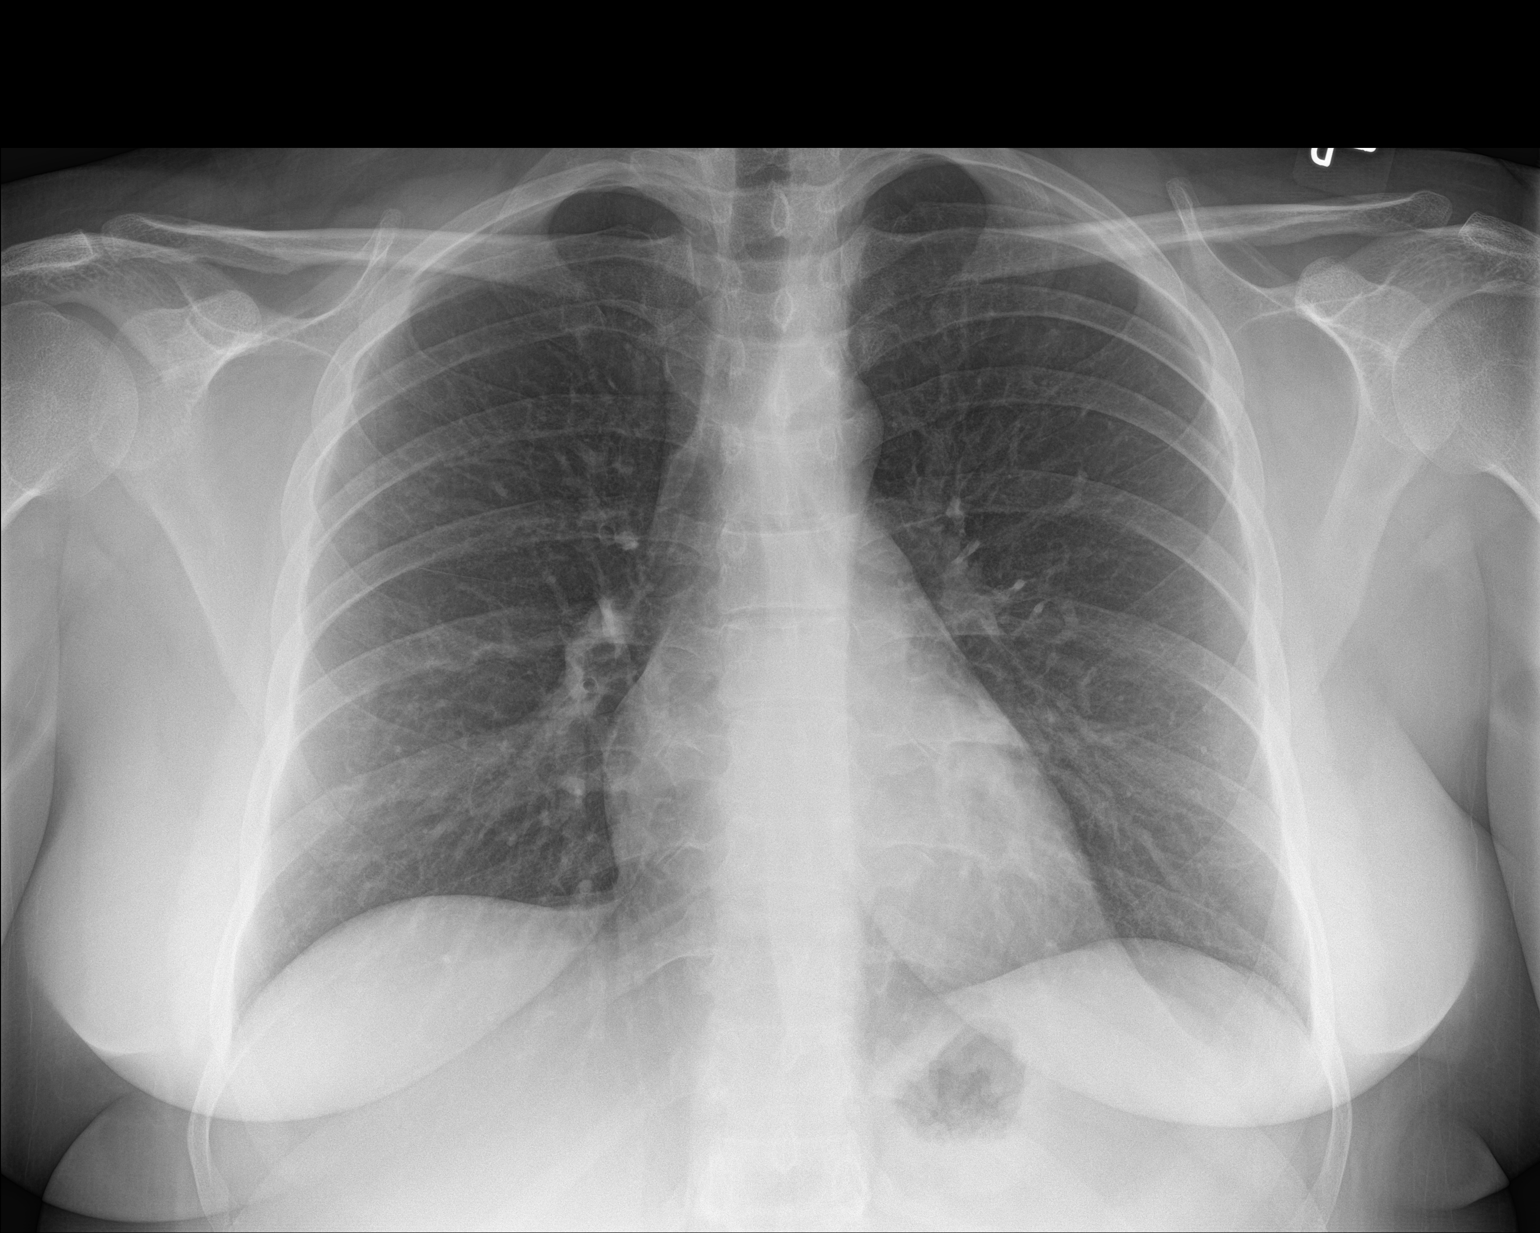

[chest lat]
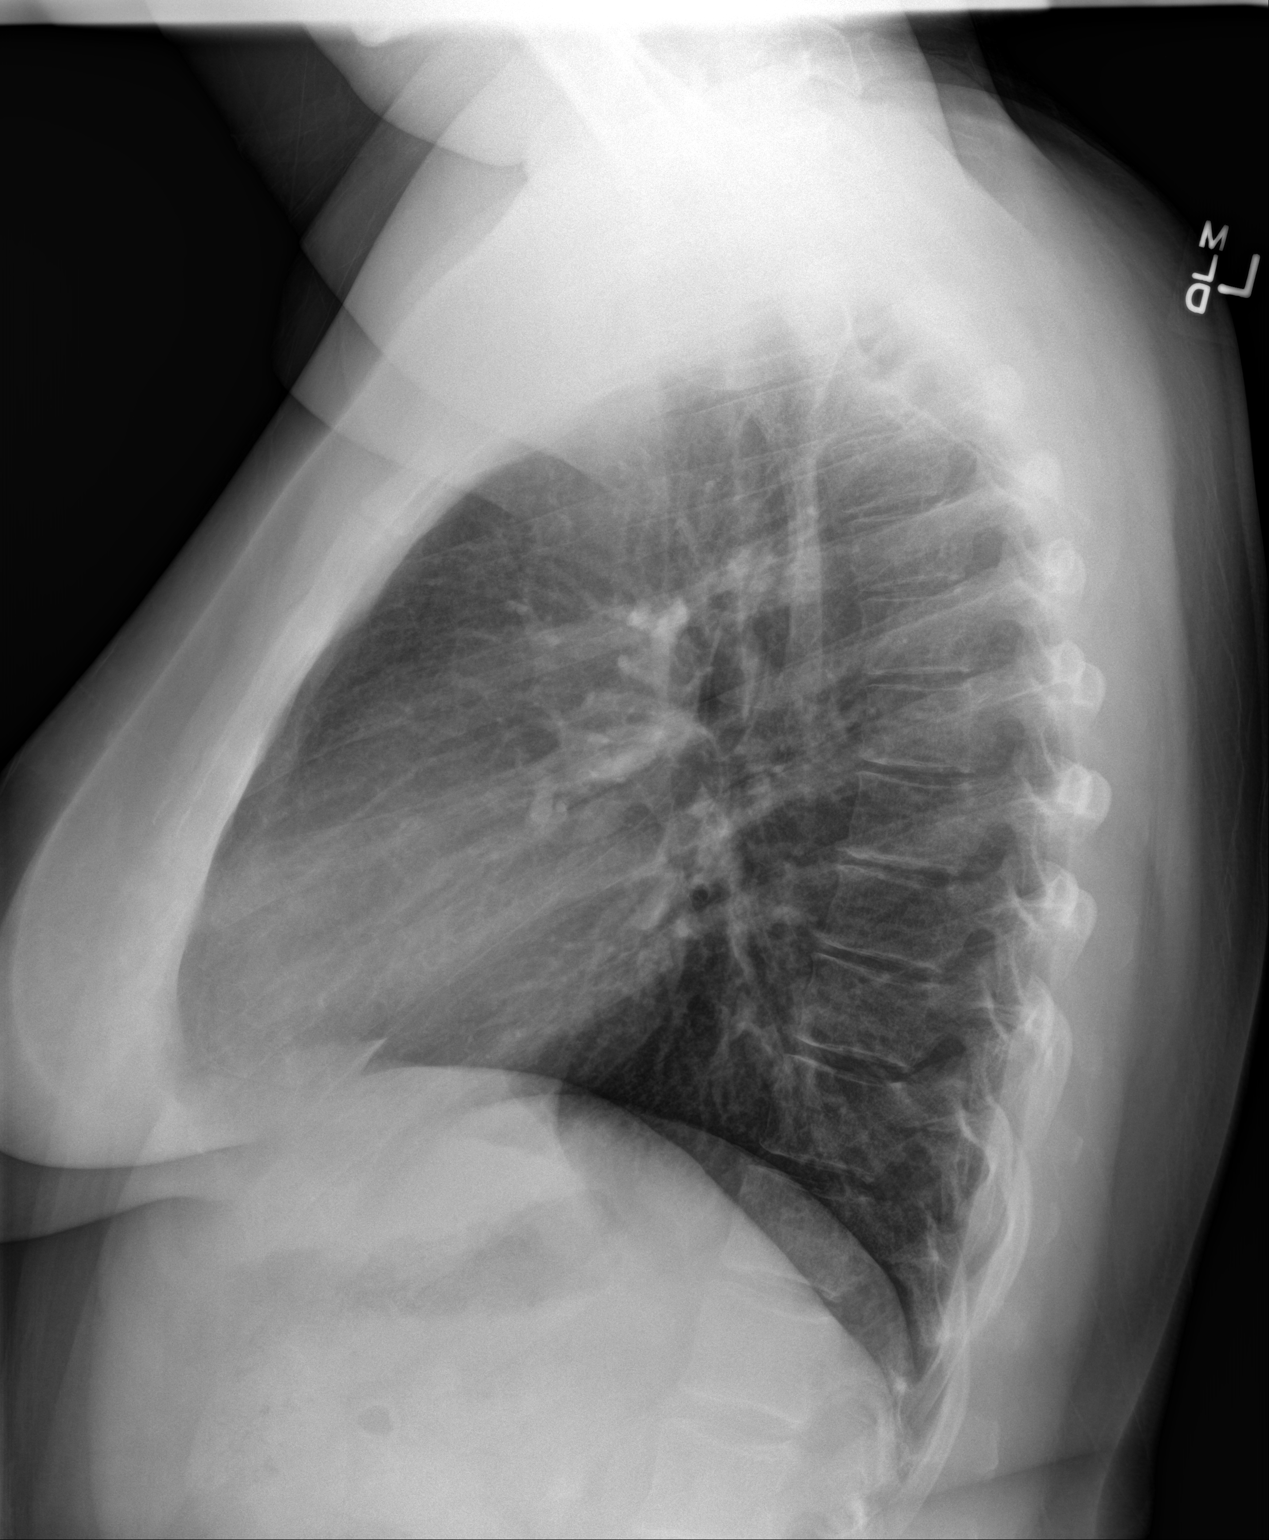

[2 of 2 positions shown; findings below may reference images not displayed]

FINDINGS: The heart size and mediastinal contours are within normal limits.
Both lungs are clear. The visualized skeletal structures are
unremarkable.
IMPRESSION: No active cardiopulmonary disease.

## 2016-06-06 IMAGING — RF DG UGI W/ KUB
13 series · 13 of 13 positions shown · non-contrast
Comparison: None.

CLINICAL DATA: Morbid obesity. Pre-op evaluation for bariatric
surgery.

EXAM:
UPPER GI SERIES WITH KUB
TECHNIQUE: After obtaining a scout radiograph a routine upper GI series was
performed using thin barium.
FLUOROSCOPY TIME:  Fluoroscopy Time (in minutes and seconds): 1
minutes 35 seconds
Number of Acquired Images:  13

[Series 1: run · 1 of 1 slices shown (1 of 13)]
[im 1/1]
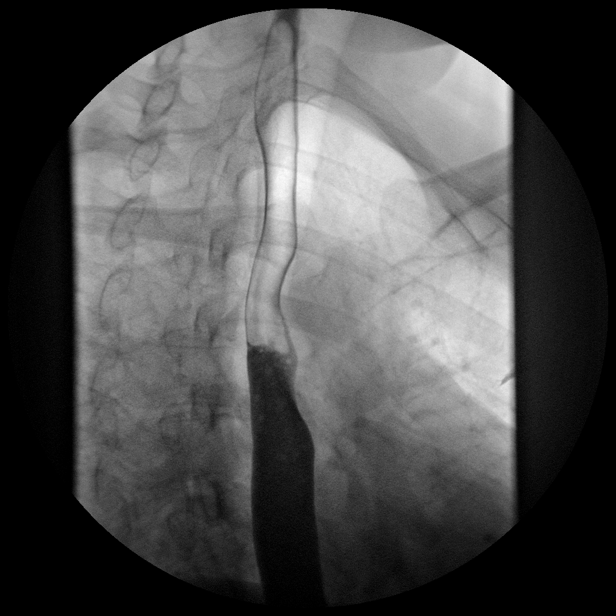

[Series 2: run · 1 of 1 slices shown (2 of 13)]
[im 1/1]
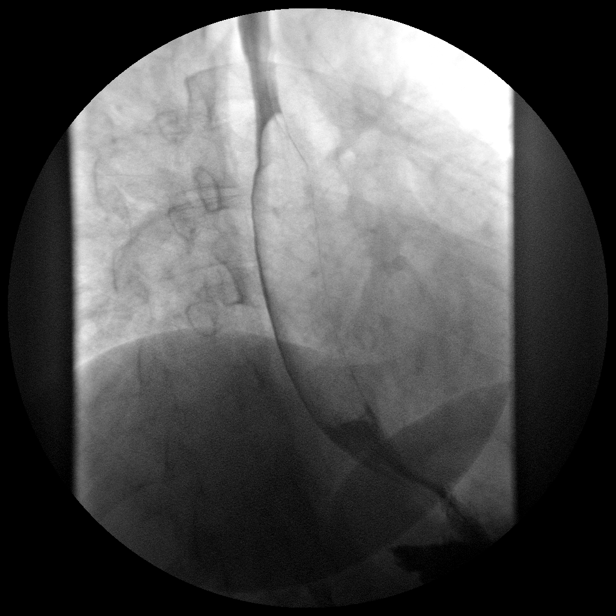

[Series 3: run · 1 of 1 slices shown (3 of 13)]
[im 1/1]
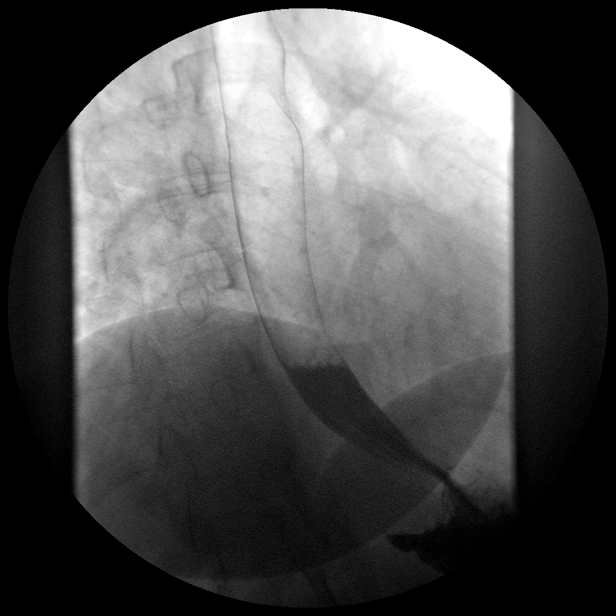

[Series 4: run · 1 of 1 slices shown (4 of 13)]
[im 1/1]
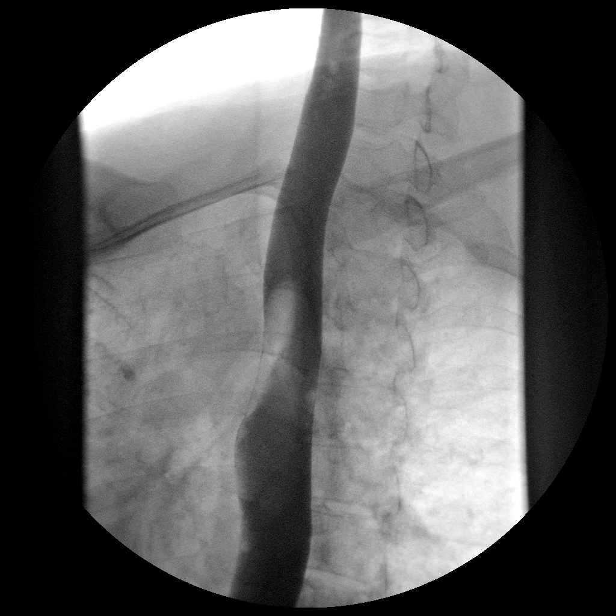

[Series 5: run · 1 of 1 slices shown (5 of 13)]
[im 1/1]
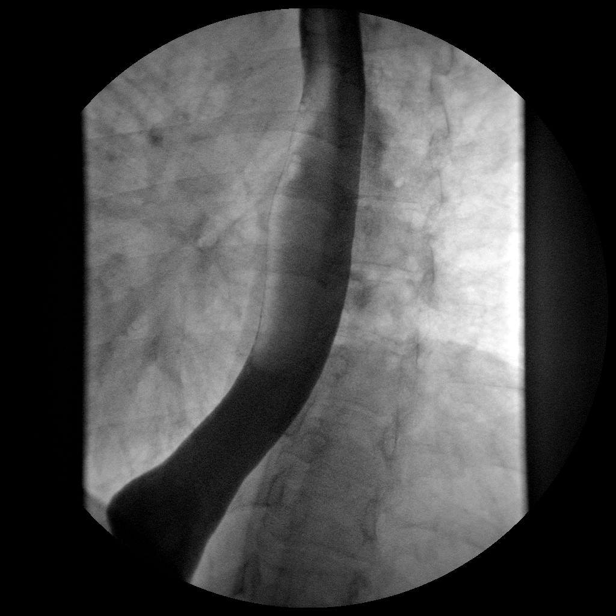

[Series 6: run · 1 of 1 slices shown (6 of 13)]
[im 1/1]
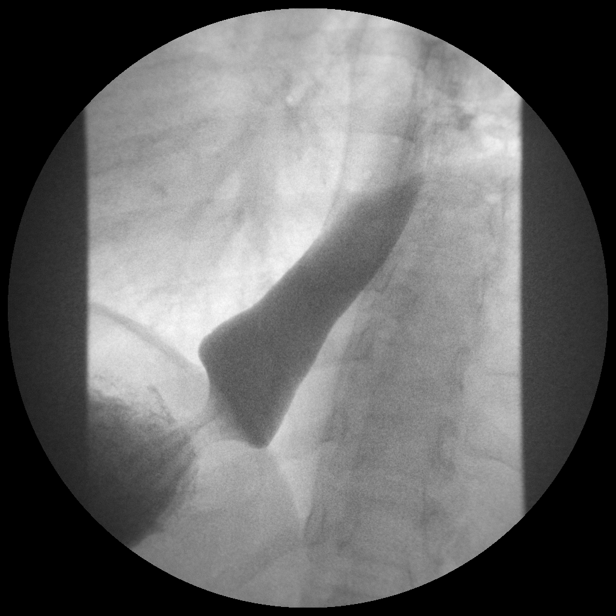

[Series 7: run · 1 of 1 slices shown (7 of 13)]
[im 1/1]
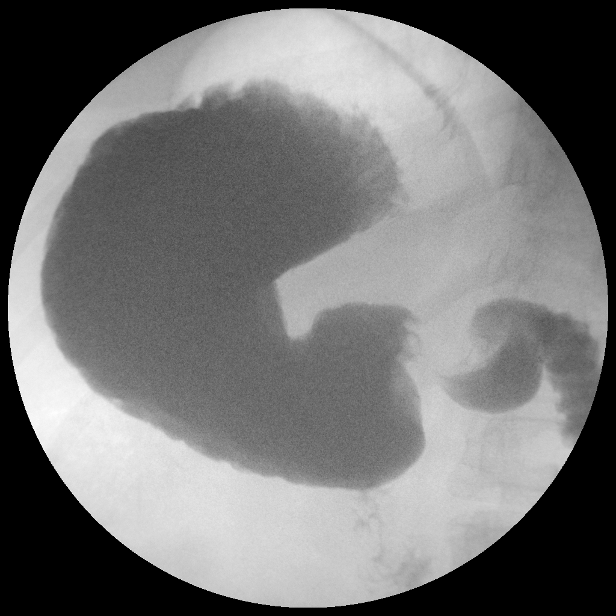

[Series 8: run · 1 of 1 slices shown (8 of 13)]
[im 1/1]
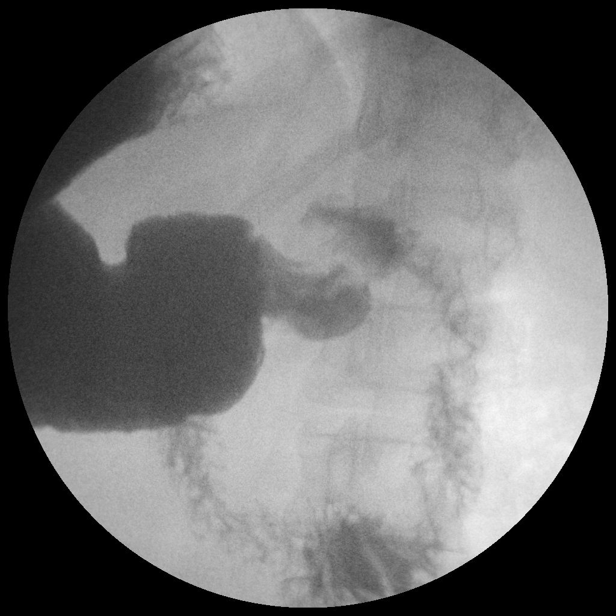

[Series 9: run · 1 of 1 slices shown (9 of 13)]
[im 1/1]
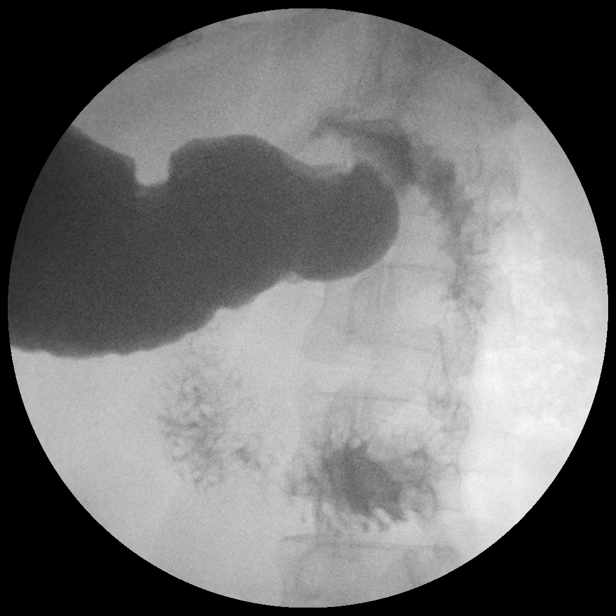

[Series 10: run · 1 of 1 slices shown (10 of 13)]
[im 1/1]
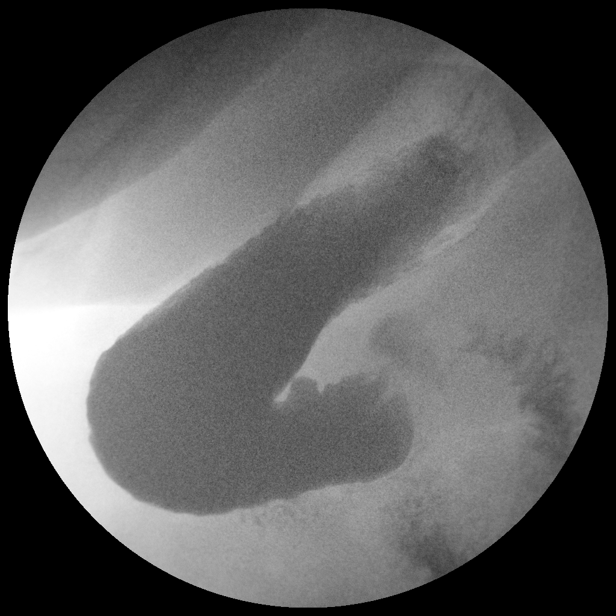

[Series 11: run · 1 of 1 slices shown (11 of 13)]
[im 1/1]
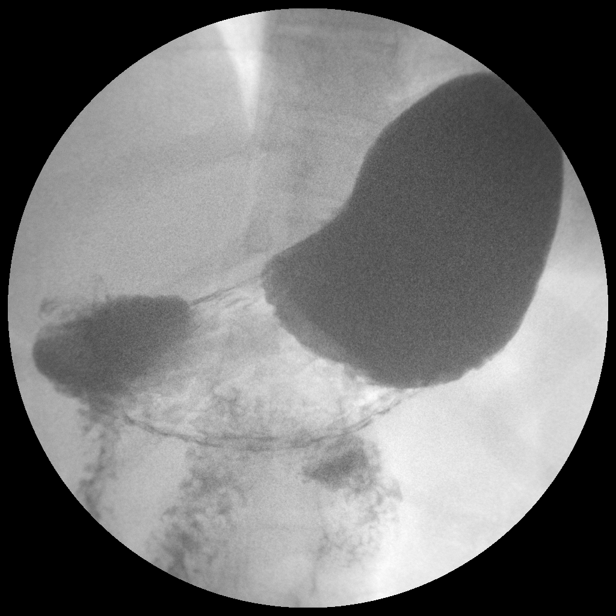

[Series 12: run · 1 of 1 slices shown (12 of 13)]
[im 1/1]
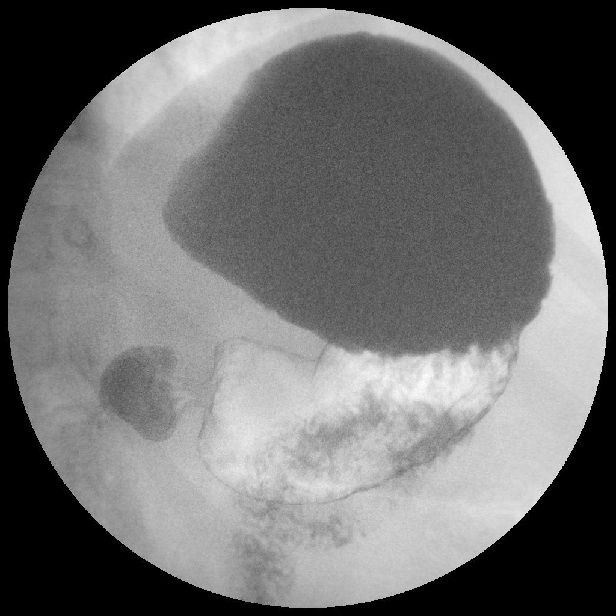

[Series 13: run · 1 of 1 slices shown (13 of 13)]
[im 1/1]
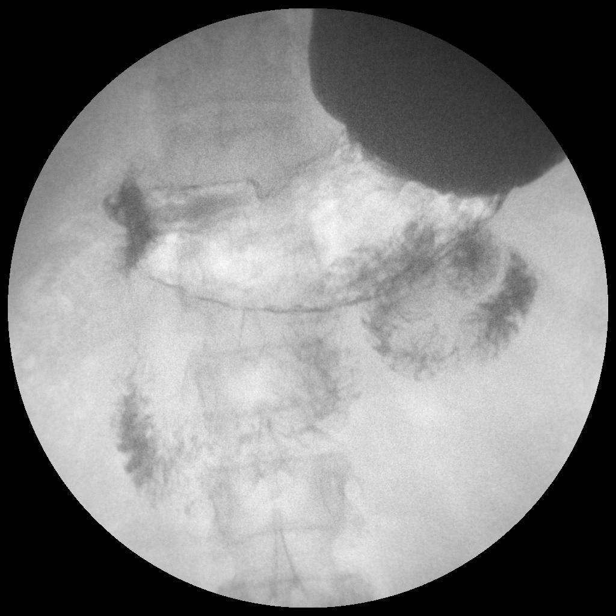

[13 of 13 positions shown; findings below may reference images not displayed]

FINDINGS: Scout radiograph:  Unremarkable bowel gas pattern.

Esophagus: No evidence of esophageal mass or stricture. Motility is
within normal limits. No gastroesophageal reflux observed.

Stomach: No hiatal hernia visualized. No evidence of gastric mass or
ulcer.

Duodenum: No ulcer or other significant abnormality seen involving
duodenal bulb or sweep.

Other:  None.
IMPRESSION: Normal upper GI series.

## 2016-08-02 DIAGNOSIS — L858 Other specified epidermal thickening: Secondary | ICD-10-CM | POA: Diagnosis not present

## 2016-08-02 DIAGNOSIS — D225 Melanocytic nevi of trunk: Secondary | ICD-10-CM | POA: Diagnosis not present

## 2016-08-02 DIAGNOSIS — D2262 Melanocytic nevi of left upper limb, including shoulder: Secondary | ICD-10-CM | POA: Diagnosis not present

## 2016-08-02 DIAGNOSIS — D2261 Melanocytic nevi of right upper limb, including shoulder: Secondary | ICD-10-CM | POA: Diagnosis not present

## 2016-08-04 DIAGNOSIS — G43909 Migraine, unspecified, not intractable, without status migrainosus: Secondary | ICD-10-CM | POA: Diagnosis not present

## 2016-08-13 DIAGNOSIS — G43909 Migraine, unspecified, not intractable, without status migrainosus: Secondary | ICD-10-CM | POA: Diagnosis not present

## 2016-08-20 DIAGNOSIS — G43909 Migraine, unspecified, not intractable, without status migrainosus: Secondary | ICD-10-CM | POA: Diagnosis not present

## 2016-08-26 DIAGNOSIS — G43909 Migraine, unspecified, not intractable, without status migrainosus: Secondary | ICD-10-CM | POA: Diagnosis not present

## 2016-09-07 DIAGNOSIS — G43909 Migraine, unspecified, not intractable, without status migrainosus: Secondary | ICD-10-CM | POA: Diagnosis not present

## 2016-09-15 DIAGNOSIS — G43909 Migraine, unspecified, not intractable, without status migrainosus: Secondary | ICD-10-CM | POA: Diagnosis not present

## 2016-09-24 DIAGNOSIS — G43909 Migraine, unspecified, not intractable, without status migrainosus: Secondary | ICD-10-CM | POA: Diagnosis not present

## 2016-09-29 DIAGNOSIS — G43909 Migraine, unspecified, not intractable, without status migrainosus: Secondary | ICD-10-CM | POA: Diagnosis not present

## 2016-11-29 DIAGNOSIS — K080 Exfoliation of teeth due to systemic causes: Secondary | ICD-10-CM | POA: Diagnosis not present

## 2016-12-06 DIAGNOSIS — Z01419 Encounter for gynecological examination (general) (routine) without abnormal findings: Secondary | ICD-10-CM | POA: Diagnosis not present

## 2016-12-06 DIAGNOSIS — Z6837 Body mass index (BMI) 37.0-37.9, adult: Secondary | ICD-10-CM | POA: Diagnosis not present

## 2017-01-04 DIAGNOSIS — K644 Residual hemorrhoidal skin tags: Secondary | ICD-10-CM | POA: Diagnosis not present

## 2017-06-16 DIAGNOSIS — Z13220 Encounter for screening for lipoid disorders: Secondary | ICD-10-CM | POA: Diagnosis not present

## 2017-06-16 DIAGNOSIS — Z0000 Encounter for general adult medical examination without abnormal findings: Secondary | ICD-10-CM | POA: Diagnosis not present

## 2017-06-16 DIAGNOSIS — D802 Selective deficiency of immunoglobulin A [IgA]: Secondary | ICD-10-CM | POA: Diagnosis not present

## 2017-06-16 DIAGNOSIS — Z79899 Other long term (current) drug therapy: Secondary | ICD-10-CM | POA: Diagnosis not present

## 2017-06-17 ENCOUNTER — Telehealth: Payer: Self-pay | Admitting: Hematology and Oncology

## 2017-06-17 NOTE — Telephone Encounter (Signed)
Faxed office note to [facility] [phone]

## 2017-06-20 DIAGNOSIS — K080 Exfoliation of teeth due to systemic causes: Secondary | ICD-10-CM | POA: Diagnosis not present

## 2017-08-01 DIAGNOSIS — L858 Other specified epidermal thickening: Secondary | ICD-10-CM | POA: Diagnosis not present

## 2017-08-01 DIAGNOSIS — D2261 Melanocytic nevi of right upper limb, including shoulder: Secondary | ICD-10-CM | POA: Diagnosis not present

## 2017-08-01 DIAGNOSIS — D2262 Melanocytic nevi of left upper limb, including shoulder: Secondary | ICD-10-CM | POA: Diagnosis not present

## 2017-08-01 DIAGNOSIS — D224 Melanocytic nevi of scalp and neck: Secondary | ICD-10-CM | POA: Diagnosis not present

## 2017-08-21 DIAGNOSIS — R509 Fever, unspecified: Secondary | ICD-10-CM | POA: Diagnosis not present

## 2017-08-21 DIAGNOSIS — R1030 Lower abdominal pain, unspecified: Secondary | ICD-10-CM | POA: Diagnosis not present

## 2017-08-22 DIAGNOSIS — R102 Pelvic and perineal pain: Secondary | ICD-10-CM | POA: Diagnosis not present

## 2017-08-22 DIAGNOSIS — M545 Low back pain: Secondary | ICD-10-CM | POA: Diagnosis not present

## 2017-08-22 DIAGNOSIS — K5900 Constipation, unspecified: Secondary | ICD-10-CM | POA: Diagnosis not present

## 2017-08-27 ENCOUNTER — Ambulatory Visit (HOSPITAL_COMMUNITY)
Admission: EM | Admit: 2017-08-27 | Discharge: 2017-08-27 | Disposition: A | Discharge status: Home / Self Care | Payer: Federal, State, Local not specified - PPO | Attending: Family Medicine | Admitting: Family Medicine

## 2017-08-27 ENCOUNTER — Encounter (HOSPITAL_COMMUNITY): Payer: Self-pay | Admitting: Emergency Medicine

## 2017-08-27 ENCOUNTER — Other Ambulatory Visit: Payer: Self-pay

## 2017-08-27 DIAGNOSIS — K5900 Constipation, unspecified: Secondary | ICD-10-CM

## 2017-08-27 MED ORDER — LINACLOTIDE 145 MCG PO CAPS: 145.0000 ug | ORAL_CAPSULE | Freq: Every day | ORAL | 0 refills | Status: AC

## 2017-08-27 NOTE — ED Triage Notes (Addendum)
Had an ultrasound last weekend for back pain, fever, abdominal pressure.  [facility] did [facility] and said no cyst, but a lot of stool.  Patient has taken multiple constipation remedies.  Patient says all she gets is colored water, no solid stool at all this week.  Patient has intermittent nausea

## 2017-08-27 NOTE — ED Provider Notes (Signed)
  [facility]    CSN: [id number] Arrival date & time: 08/27/17  1212  Chief Complaint  Patient presents with  . Constipation    Subjective: Patient is a 32 y.o. female here for constipation.  Pt has not had BM for over 1 week. She had a small amount of liquidy stool. She has tried MiraLAX, several suppositories, Dulcolax, and mag citrate.  She also tried manual disimpaction without relief.  She did not feel any stool in the rectal vault.  The patient is having some nausea, decreased appetite, and bloating.  She is also passing gas.  She denies any bleeding, abdominal pain, vomiting, injury, or recent change in medication.   ROS: GI: As noted in HPI  Family History  Problem Relation Age of Onset  . Hypertension Father   . Kidney Stones Father   . Migraines Father   . Depression Father   . Muscular dystrophy Father   . Heart disease Maternal Grandmother   . Hypertension Maternal Grandmother   . Cancer Maternal Grandmother        kidney  . Depression Maternal Grandmother   . Heart disease Maternal Grandfather   . Hypertension Maternal Grandfather   . Depression Maternal Grandfather   . Heart disease Paternal Grandmother   . Hypertension Paternal Grandmother   . Depression Paternal Grandmother   . Muscular dystrophy Paternal Grandmother   . Heart disease Paternal Grandfather   . Hypertension Paternal Grandfather   . Cancer Paternal Grandfather        colon, leaukemia  . Colon cancer Paternal Grandfather   . Other Neg Hx   . Esophageal cancer Neg Hx   . Rectal cancer Neg Hx   . Stomach cancer Neg Hx    Past Medical History:  Diagnosis Date  . Abnormal Pap smear   . Elevated antinuclear antibody (ANA) level 10/01/2013  . Hx of varicella   . 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 Polycystic ovarian syndrome   . PONV (postoperative nausea and vomiting)    Allergies  Allergen Reactions  . Other Anaphylaxis    IGA DEFICIENCY-HIGH  RISK POTENTIAL FOR ANAPHYLAXIS WITH BLOOD COMPONENTS  . Chlorhexidine Gluconate Dermatitis    Broke out in rash post use of product  . Hydromorphone Nausea And Vomiting   No current facility-administered medications for this encounter.   Current Outpatient Medications:  .  cetirizine (ZYRTEC) 10 MG tablet, Take 10 mg by mouth daily., Disp: , Rfl:  .  Erenumab-aooe (AIMOVIG [place]), Inject into the skin., Disp: , Rfl:   Objective: BP 117/85 (BP Location: Right Arm)   Pulse 94   Temp 98 F (36.7 C) (Oral)   Resp 18   LMP 08/18/2013   SpO2 100%  General: Awake, appears stated age HEENT: MMM, EOMi Heart: RRR, no murmurs Lungs: CTAB, no rales, wheezes or rhonchi. No accessory muscle use Abd: BS+ and hyperactive, soft, NT, mild distention, no masses or organomegaly Psych: Age appropriate judgment and insight, normal affect and mood  Assessment and Plan: Constipation, unspecified constipation type  She needs an enema, recommended 2 today and 1 daily until resolution.  Continue MiraLAX.  Stay well-hydrated.  Follow-up with PCP Monday if no improvement. The patient voiced understanding and agreement to the plan.      Sharlene Dory, DO 08/27/17 1325

## 2017-08-27 NOTE — Discharge Instructions (Addendum)
Things to look out for: fevers, worsening abdominal pain, vomiting, worsening nausea, or if we stop passing gas and stool altogether.  Use an enema, it may take multiple. Continue twice daily MiraLax. Stay very well hydrated.  If things aren't better by this week, call your PCP.

## 2017-08-29 ENCOUNTER — Telehealth: Payer: Self-pay

## 2017-08-29 ENCOUNTER — Telehealth: Payer: Self-pay | Admitting: Gastroenterology

## 2017-08-29 NOTE — Telephone Encounter (Signed)
Pt has been dealing with constipation for the past week. She stated to have taken extreme meassures with no results. She is requesting to be seen today. Pls call her

## 2017-08-29 NOTE — Telephone Encounter (Signed)
PA approved. Effective 07/30/2017 through 08/29/2018.

## 2017-08-29 NOTE — Telephone Encounter (Signed)
Pt seen by Dr. Carmelia Roller at urgent care on 08/27/2017. Rx'ed Linzess- which needs PA.   PA initiated via Covermymeds; KEY: [id number]. Awaiting determination.

## 2017-08-29 NOTE — Telephone Encounter (Signed)
The pt complains of constipation, she has tried measures at home with no relief.  Appt given for 5/8 with Dr Christella Hartigan.

## 2017-08-31 ENCOUNTER — Ambulatory Visit: Payer: Federal, State, Local not specified - PPO | Admitting: Gastroenterology

## 2017-08-31 ENCOUNTER — Ambulatory Visit (INDEPENDENT_AMBULATORY_CARE_PROVIDER_SITE_OTHER)
Admission: RE | Admit: 2017-08-31 | Discharge: 2017-08-31 | Disposition: A | Discharge status: Home / Self Care | Payer: Federal, State, Local not specified - PPO | Source: Ambulatory Visit | Attending: Gastroenterology | Admitting: Gastroenterology

## 2017-08-31 ENCOUNTER — Encounter: Payer: Self-pay | Admitting: Gastroenterology

## 2017-08-31 VITALS — BP 102/78 | HR 70 | Ht 64.0 in | Wt 218.0 lb

## 2017-08-31 DIAGNOSIS — K5900 Constipation, unspecified: Secondary | ICD-10-CM

## 2017-08-31 MED ORDER — PEG 3350-KCL-NA BICARB-NACL 420 G PO SOLR: 4000.0000 mL | ORAL | 0 refills | Status: AC

## 2017-08-31 NOTE — Progress Notes (Signed)
Review of pertinent gastrointestinal problems: 1. Diarrhea: Work-up 2015 IgA level was very low. EGD May 2015 was normal.  Biopsies of the duodenum were normal.  Colonoscopy May 2015 was normal, terminal ileum was normal.  Random biopsies of the colon were negative for microscopic colitis.  Empiric course of Flagyl.  She was found to have IgA deficiency, evaluated by hematology and they told her to avoid gluten.  This significantly helped her diarrhea   HPI: This is a very pleasant 32 year old woman who was referred to me by Lupita Raider, MD  to evaluate constipation.    Chief complaint is constipation  I last saw her about 4 years ago, her diarrhea was much improved when she started to avoid gluten.  Her bowels have been great for her for at least 3 or 4 years.  3 months ago she started a high-protein diet and within 3 weeks of making that change she began to become constipated.  The constipation gradually worsened so that she was only having a bowel movement with extreme effort every week or so.  No bleeding.  Her weight has been up and down.  She has tried purging with MiraLAX, mag citrate without any improvement. She has Generalized abdominal pain.  Mild nausea but no vomiting.  No fevers or chills    Review of systems: Pertinent positive and negative review of systems were noted in the above HPI section. All other review negative.   Past Medical History:  Diagnosis Date  . Abnormal Pap smear   . Elevated antinuclear antibody (ANA) level 10/01/2013  . Hx of varicella   . 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 Polycystic ovarian syndrome   . PONV (postoperative nausea and vomiting)     Past Surgical History:  Procedure Laterality Date  . ABDOMINAL HYSTERECTOMY    . ADENOIDECTOMY    . LAPAROSCOPIC GASTRIC BANDING N/A 12/03/2014   Procedure: LAPAROSCOPIC GASTRIC BANDING;  Surgeon: Luretha Murphy, MD;  Location: [facility];  Service: General;   Laterality: N/A;  . MYRINGOTOMY    . NO PAST SURGERIES    . tube in ears      Current Outpatient Medications  Medication Sig Dispense Refill  . cetirizine (ZYRTEC) 10 MG tablet Take 10 mg by mouth daily.    Dorise Hiss (AIMOVIG [place]) Inject into the skin.    Marland Kitchen linaclotide (LINZESS) 145 MCG CAPS capsule Take 1 capsule (145 mcg total) by mouth daily before breakfast. (Patient not taking: Reported on 08/31/2017) 30 capsule 0   No current facility-administered medications for this visit.     Allergies as of 08/31/2017 - Review Complete 08/31/2017  Allergen Reaction Noted  . Other Anaphylaxis 12/02/2014  . Chlorhexidine gluconate Dermatitis 08/27/2013  . Hydromorphone Nausea And Vomiting 11/29/2014    Family History  Problem Relation Age of Onset  . Hypertension Father   . Kidney Stones Father   . Migraines Father   . Depression Father   . Muscular dystrophy Father   . Heart disease Maternal Grandmother   . Hypertension Maternal Grandmother   . Cancer Maternal Grandmother        kidney  . Depression Maternal Grandmother   . Heart disease Maternal Grandfather   . Hypertension Maternal Grandfather   . Depression Maternal Grandfather   . Heart disease Paternal Grandmother   . Hypertension Paternal Grandmother   . Depression Paternal Grandmother   . Muscular dystrophy Paternal Grandmother   . Heart disease  Paternal Grandfather   . Hypertension Paternal Grandfather   . Cancer Paternal Grandfather        colon, leaukemia  . Colon cancer Paternal Grandfather   . Other Neg Hx   . Esophageal cancer Neg Hx   . Rectal cancer Neg Hx   . Stomach cancer Neg Hx     Social History   Socioeconomic History  . Marital status: Married    Spouse name: Not on file  . Number of children: Not on file  . Years of education: Not on file  . Highest education level: Not on file  Occupational History  . Not on file  Social Needs  . Financial resource strain: Not on file  . Food  insecurity:    Worry: Not on file    Inability: Not on file  . Transportation needs:    Medical: Not on file    Non-medical: Not on file  Tobacco Use  . Smoking status: Never Smoker  . Smokeless tobacco: Never Used  Substance and Sexual Activity  . Alcohol use: No  . Drug use: No  . Sexual activity: Yes    Birth control/protection: Condom  Lifestyle  . Physical activity:    Days per week: Not on file    Minutes per session: Not on file  . Stress: Not on file  Relationships  . Social connections:    Talks on phone: Not on file    Gets together: Not on file    Attends religious service: Not on file    Active member of club or organization: Not on file    Attends meetings of clubs or organizations: Not on file    Relationship status: Not on file  . Intimate partner violence:    Fear of current or ex partner: Not on file    Emotionally abused: Not on file    Physically abused: Not on file    Forced sexual activity: Not on file  Other Topics Concern  . Not on file  Social History Narrative  . Not on file     Physical Exam: BP 102/78   Pulse 70   Ht  (1.626 m)   Wt 218 lb (98.9 kg)   LMP 08/18/2013   BMI 37.42 kg/m  Constitutional: generally well-appearing Psychiatric: alert and oriented x3 Eyes: extraocular movements intact Mouth: oral pharynx moist, no lesions Neck: supple no lymphadenopathy Cardiovascular: heart regular rate and rhythm Lungs: clear to auscultation bilaterally Abdomen: soft, nontender, nondistended, no obvious ascites, no peritoneal signs, normal bowel sounds Extremities: no lower extremity edema bilaterally Skin: no lesions on visible extremities   Assessment and plan: 32 y.o. female with recent significant constipation  This all started about 3 weeks after she began a high-protein diet and I think that that is likely the etiology.  She is going to complete a GoLYTELY gallon purge and then she will take MiraLAX 2 scoops every day after  that.  She will call in 5 to 7 days to report on her response.  She will also get some abdominal plain films today to check for obstruction which I think is highly unlikely.    Please see the "Patient Instructions" section for addition details about the plan.   Rob Bunting, MD [facility] 08/31/2017, 9:18 AM  Cc: Lupita Raider, MD

## 2017-08-31 NOTE — Patient Instructions (Addendum)
Golytely purge; drink full gallon over several hours. Miralax two scoops daily after that. Call in 5-7 days. Abdominal films today.  Normal BMI (Body Mass Index- based on height and weight) is between 19 and 25. Your BMI today is Body mass index is 37.42 kg/m. Marland Kitchen Please consider follow up  regarding your BMI with your Primary Care Provider.

## 2017-12-07 DIAGNOSIS — Z01419 Encounter for gynecological examination (general) (routine) without abnormal findings: Secondary | ICD-10-CM | POA: Diagnosis not present

## 2017-12-07 DIAGNOSIS — Z6836 Body mass index (BMI) 36.0-36.9, adult: Secondary | ICD-10-CM | POA: Diagnosis not present

## 2017-12-27 DIAGNOSIS — E7800 Pure hypercholesterolemia, unspecified: Secondary | ICD-10-CM | POA: Diagnosis not present

## 2018-01-18 DIAGNOSIS — E6609 Other obesity due to excess calories: Secondary | ICD-10-CM | POA: Diagnosis not present

## 2018-01-23 DIAGNOSIS — Z809 Family history of malignant neoplasm, unspecified: Secondary | ICD-10-CM | POA: Diagnosis not present

## 2018-02-07 DIAGNOSIS — K080 Exfoliation of teeth due to systemic causes: Secondary | ICD-10-CM | POA: Diagnosis not present

## 2018-02-22 DIAGNOSIS — G43909 Migraine, unspecified, not intractable, without status migrainosus: Secondary | ICD-10-CM | POA: Diagnosis not present

## 2018-02-22 DIAGNOSIS — K5900 Constipation, unspecified: Secondary | ICD-10-CM | POA: Diagnosis not present

## 2018-02-22 DIAGNOSIS — F411 Generalized anxiety disorder: Secondary | ICD-10-CM | POA: Diagnosis not present

## 2018-04-05 DIAGNOSIS — Z4651 Encounter for fitting and adjustment of gastric lap band: Secondary | ICD-10-CM | POA: Diagnosis not present

## 2018-06-19 DIAGNOSIS — Z0000 Encounter for general adult medical examination without abnormal findings: Secondary | ICD-10-CM | POA: Diagnosis not present

## 2018-06-19 DIAGNOSIS — Z79899 Other long term (current) drug therapy: Secondary | ICD-10-CM | POA: Diagnosis not present

## 2018-06-19 DIAGNOSIS — Z13220 Encounter for screening for lipoid disorders: Secondary | ICD-10-CM | POA: Diagnosis not present

## 2018-10-20 DIAGNOSIS — N760 Acute vaginitis: Secondary | ICD-10-CM | POA: Diagnosis not present

## 2018-10-20 DIAGNOSIS — R102 Pelvic and perineal pain: Secondary | ICD-10-CM | POA: Diagnosis not present

## 2018-11-22 DIAGNOSIS — D224 Melanocytic nevi of scalp and neck: Secondary | ICD-10-CM | POA: Diagnosis not present

## 2018-11-22 DIAGNOSIS — D2261 Melanocytic nevi of right upper limb, including shoulder: Secondary | ICD-10-CM | POA: Diagnosis not present

## 2018-11-22 DIAGNOSIS — D225 Melanocytic nevi of trunk: Secondary | ICD-10-CM | POA: Diagnosis not present

## 2018-11-22 DIAGNOSIS — D485 Neoplasm of uncertain behavior of skin: Secondary | ICD-10-CM | POA: Diagnosis not present

## 2018-12-21 DIAGNOSIS — E7800 Pure hypercholesterolemia, unspecified: Secondary | ICD-10-CM | POA: Diagnosis not present

## 2018-12-21 DIAGNOSIS — R638 Other symptoms and signs concerning food and fluid intake: Secondary | ICD-10-CM | POA: Diagnosis not present

## 2019-01-02 DIAGNOSIS — Z01419 Encounter for gynecological examination (general) (routine) without abnormal findings: Secondary | ICD-10-CM | POA: Diagnosis not present

## 2019-01-02 DIAGNOSIS — Z6836 Body mass index (BMI) 36.0-36.9, adult: Secondary | ICD-10-CM | POA: Diagnosis not present

## 2019-02-12 DIAGNOSIS — N760 Acute vaginitis: Secondary | ICD-10-CM | POA: Diagnosis not present

## 2019-02-12 DIAGNOSIS — R102 Pelvic and perineal pain: Secondary | ICD-10-CM | POA: Diagnosis not present

## 2019-05-02 DIAGNOSIS — Z4651 Encounter for fitting and adjustment of gastric lap band: Secondary | ICD-10-CM | POA: Diagnosis not present

## 2019-05-08 ENCOUNTER — Other Ambulatory Visit: Payer: Self-pay | Admitting: Surgery

## 2019-05-08 DIAGNOSIS — Z4651 Encounter for fitting and adjustment of gastric lap band: Secondary | ICD-10-CM

## 2019-05-17 ENCOUNTER — Other Ambulatory Visit: Payer: Self-pay | Admitting: Surgery

## 2019-05-17 ENCOUNTER — Ambulatory Visit
Admission: RE | Admit: 2019-05-17 | Discharge: 2019-05-17 | Disposition: A | Discharge status: Home / Self Care | Payer: Federal, State, Local not specified - PPO | Source: Ambulatory Visit | Attending: Surgery | Admitting: Surgery

## 2019-05-17 DIAGNOSIS — R1013 Epigastric pain: Secondary | ICD-10-CM | POA: Diagnosis not present

## 2019-05-17 DIAGNOSIS — Z4651 Encounter for fitting and adjustment of gastric lap band: Secondary | ICD-10-CM

## 2019-05-17 DIAGNOSIS — R110 Nausea: Secondary | ICD-10-CM | POA: Diagnosis not present

## 2019-06-20 DIAGNOSIS — Z4651 Encounter for fitting and adjustment of gastric lap band: Secondary | ICD-10-CM | POA: Diagnosis not present

## 2019-09-05 DIAGNOSIS — Z4651 Encounter for fitting and adjustment of gastric lap band: Secondary | ICD-10-CM | POA: Diagnosis not present

## 2019-10-31 DIAGNOSIS — Z0000 Encounter for general adult medical examination without abnormal findings: Secondary | ICD-10-CM | POA: Diagnosis not present

## 2019-10-31 DIAGNOSIS — Z79899 Other long term (current) drug therapy: Secondary | ICD-10-CM | POA: Diagnosis not present

## 2019-10-31 DIAGNOSIS — K5900 Constipation, unspecified: Secondary | ICD-10-CM | POA: Diagnosis not present

## 2019-10-31 DIAGNOSIS — Z13220 Encounter for screening for lipoid disorders: Secondary | ICD-10-CM | POA: Diagnosis not present

## 2019-11-20 DIAGNOSIS — F438 Other reactions to severe stress: Secondary | ICD-10-CM | POA: Diagnosis not present

## 2019-11-26 DIAGNOSIS — D2271 Melanocytic nevi of right lower limb, including hip: Secondary | ICD-10-CM | POA: Diagnosis not present

## 2019-11-26 DIAGNOSIS — D2261 Melanocytic nevi of right upper limb, including shoulder: Secondary | ICD-10-CM | POA: Diagnosis not present

## 2019-11-26 DIAGNOSIS — D2262 Melanocytic nevi of left upper limb, including shoulder: Secondary | ICD-10-CM | POA: Diagnosis not present

## 2019-11-26 DIAGNOSIS — D224 Melanocytic nevi of scalp and neck: Secondary | ICD-10-CM | POA: Diagnosis not present

## 2019-11-28 DIAGNOSIS — F438 Other reactions to severe stress: Secondary | ICD-10-CM | POA: Diagnosis not present

## 2019-12-04 DIAGNOSIS — F438 Other reactions to severe stress: Secondary | ICD-10-CM | POA: Diagnosis not present

## 2019-12-11 DIAGNOSIS — F438 Other reactions to severe stress: Secondary | ICD-10-CM | POA: Diagnosis not present

## 2019-12-18 DIAGNOSIS — F438 Other reactions to severe stress: Secondary | ICD-10-CM | POA: Diagnosis not present

## 2019-12-25 DIAGNOSIS — F438 Other reactions to severe stress: Secondary | ICD-10-CM | POA: Diagnosis not present

## 2020-01-03 DIAGNOSIS — F438 Other reactions to severe stress: Secondary | ICD-10-CM | POA: Diagnosis not present

## 2020-01-07 DIAGNOSIS — F438 Other reactions to severe stress: Secondary | ICD-10-CM | POA: Diagnosis not present

## 2020-01-30 DIAGNOSIS — Z4651 Encounter for fitting and adjustment of gastric lap band: Secondary | ICD-10-CM | POA: Diagnosis not present

## 2020-02-05 ENCOUNTER — Other Ambulatory Visit: Payer: Self-pay | Admitting: Surgery

## 2020-02-05 DIAGNOSIS — Z4651 Encounter for fitting and adjustment of gastric lap band: Secondary | ICD-10-CM

## 2021-07-09 ENCOUNTER — Emergency Department (HOSPITAL_COMMUNITY): Payer: PRIVATE HEALTH INSURANCE

## 2021-07-09 ENCOUNTER — Encounter (HOSPITAL_COMMUNITY): Payer: Self-pay

## 2021-07-09 ENCOUNTER — Emergency Department (HOSPITAL_COMMUNITY)
Admission: EM | Admit: 2021-07-09 | Discharge: 2021-07-09 | Disposition: A | Discharge status: Home / Self Care | Payer: PRIVATE HEALTH INSURANCE | Attending: Student | Admitting: Student

## 2021-07-09 ENCOUNTER — Other Ambulatory Visit: Payer: Self-pay

## 2021-07-09 DIAGNOSIS — R002 Palpitations: Secondary | ICD-10-CM | POA: Diagnosis not present

## 2021-07-09 DIAGNOSIS — R0602 Shortness of breath: Secondary | ICD-10-CM | POA: Diagnosis not present

## 2021-07-09 DIAGNOSIS — K5732 Diverticulitis of large intestine without perforation or abscess without bleeding: Secondary | ICD-10-CM | POA: Diagnosis not present

## 2021-07-09 DIAGNOSIS — R0789 Other chest pain: Secondary | ICD-10-CM | POA: Diagnosis not present

## 2021-07-09 LAB — BASIC METABOLIC PANEL
Anion gap: 9 (ref 5–15)
BUN: 11 mg/dL (ref 6–20)
CO2: 25 mmol/L (ref 22–32)
Calcium: 9.1 mg/dL (ref 8.9–10.3)
Chloride: 103 mmol/L (ref 98–111)
Creatinine, Ser: 0.71 mg/dL (ref 0.44–1.00)
GFR, Estimated: >60 mL/min (ref >60)
Glucose, Bld: 96 mg/dL (ref 70–99)
Potassium: 3.7 mmol/L (ref 3.5–5.1)
Sodium: 137 mmol/L (ref 135–145)

## 2021-07-09 LAB — CBC
HCT: 38.3 % (ref 36.0–46.0)
Hemoglobin: 13 g/dL (ref 12.0–15.0)
MCH: 28.9 pg (ref 26.0–34.0)
MCHC: 33.9 g/dL (ref 30.0–36.0)
MCV: 85.1 fL (ref 80.0–100.0)
Platelets: 221 10*3/uL (ref 150–400)
RBC: 4.5 MIL/uL (ref 3.87–5.11)
RDW: 12.4 % (ref 11.5–15.5)
WBC: 8.5 10*3/uL (ref 4.0–10.5)
nRBC: 0 % (ref 0.0–0.2)

## 2021-07-09 LAB — DIFFERENTIAL
Abs Immature Granulocytes: 0.03 10*3/uL (ref 0.00–0.07)
Basophils Absolute: 0 10*3/uL (ref 0.0–0.1)
Basophils Relative: 1 %
Eosinophils Absolute: 0 10*3/uL (ref 0.0–0.5)
Eosinophils Relative: 0 %
Immature Granulocytes: 0 %
Lymphocytes Relative: 18 %
Lymphs Abs: 1.5 10*3/uL (ref 0.7–4.0)
Monocytes Absolute: 0.5 10*3/uL (ref 0.1–1.0)
Monocytes Relative: 6 %
Neutro Abs: 6.4 10*3/uL (ref 1.7–7.7)
Neutrophils Relative %: 75 %

## 2021-07-09 LAB — TSH: TSH: 1.745 u[IU]/mL (ref 0.350–4.500)

## 2021-07-09 LAB — D-DIMER, QUANTITATIVE: D-Dimer, Quant: 0.36 ug/mL-FEU (ref 0.00–0.50)

## 2021-07-09 LAB — TROPONIN I (HIGH SENSITIVITY): Troponin I (High Sensitivity): <2 ng/L (ref <18)

## 2021-07-09 MED ORDER — HYDROXYZINE HCL 25 MG PO TABS
25.0000 mg | ORAL_TABLET | Freq: Once | ORAL | Status: DC
  Filled 2021-07-09: qty 1

## 2021-07-09 MED ORDER — LORAZEPAM 2 MG/ML IJ SOLN
0.5000 mg | Freq: Once | INTRAMUSCULAR | Status: AC
  Administered 2021-07-09: 0.5 mg via INTRAVENOUS
  Filled 2021-07-09: qty 1

## 2021-07-09 MED ORDER — LACTATED RINGERS IV BOLUS
1000.0000 mL | Freq: Once | INTRAVENOUS | Status: AC
  Administered 2021-07-09: 1000 mL via INTRAVENOUS

## 2021-07-09 MED ORDER — IOHEXOL 300 MG/ML  SOLN
100.0000 mL | Freq: Once | INTRAMUSCULAR | Status: AC | PRN
  Administered 2021-07-09: 100 mL via INTRAVENOUS

## 2021-07-09 NOTE — ED Notes (Addendum)
Pt states that she had gastric band placed in 2016 and has had fluid added since. Pt had fluid added recently, but the past few days, states that she has had palpitations, SOB and CP. Pt states she went and had all the fluid removed from the lap band today and was hoping her symptoms wound subside with the lack of gastric restriction, but symptoms have not improved. Pt states that she is worried her band is misplaced/has slipped, but due to her symptoms, the clinic would "not touch her" until she came to be evaluated for PE at ED. ? ?Pt states that her chest pain is midsternal and feels like a lot of pressure. CP is exacerbated with exertion and alleviated with rest. Pt states that she also becomes "almost dizzy" while exerting herself. Pt states her palpitations are worsenened by exertion, laying down and eating. Denies personal and family hx of blood clots, clotting disorders, cardiac events. ?

## 2021-07-09 NOTE — ED Provider Notes (Signed)
?[facility]-EMERGENCY DEPT ?Provider Note ? ?CSN: [id number] ?Arrival date & time: 07/09/21 1459 ? ?Chief Complaint(s) ?Palpitations, Shortness of Breath, and Chest Pain ? ?HPI ?Kelsey Gonzales is a 36 y.o. female with PMH PCOS, anxiety, IgA deficiency, lap band placement who presents the emergency department for evaluation of palpitations, shortness of breath and chest pressure.  Patient states that she recently had her Lap-Band filled with more fluid and after after this procedure she developed palpitations and chest pressure.  She states that the symptoms been present for approximately 3 days and gradually worsening.  She saw her outpatient bariatric providers today who removed the fluid entirely but she states that the symptoms have not resolved.  She arrives with concerns of malpositioning of the Lap-Band.  Denies abdominal pain, nausea, vomiting, headache, fever or dysphagia. ? ? ?Palpitations ?Associated symptoms: chest pain and shortness of breath   ?Shortness of Breath ?Associated symptoms: chest pain   ?Chest Pain ?Associated symptoms: palpitations and shortness of breath   ? ?Past Medical History ?Past Medical History:  ?Diagnosis Date  ? Abnormal Pap smear   ? Elevated antinuclear antibody (ANA) level 10/01/2013  ? Hx of varicella   ? IgA deficiency (HCC)   ? pt states can not have blood transfusion  ? Polycystic ovarian syndrome   ? PONV (postoperative nausea and vomiting)   ? ?Patient Active Problem List  ? Diagnosis Date Noted  ? Elevated antinuclear antibody (ANA) level 10/01/2013  ? Immune deficiency disorder (HCC) 09/27/2013  ? Recurrent epistaxis 09/27/2013  ? Joint pain 09/27/2013  ? ?Home Medication(s) ?Prior to Admission medications   ?Medication Sig Start Date End Date Taking? Authorizing Provider  ?cetirizine (ZYRTEC) 10 MG tablet Take 10 mg by mouth daily.    [provider]  ?Dorise Hiss (AIMOVIG White) Inject into the skin.    [provider]   ?linaclotide Karlene Einstein) 145 MCG CAPS capsule Take 1 capsule (145 mcg total) by mouth daily before breakfast. ?Patient not taking: Reported on 08/31/2017 08/27/17   Sharlene Dory, DO  ?polyethylene glycol-electrolytes (NULYTELY/GOLYTELY) 420 g solution Take 4,000 mLs by mouth as directed. 08/31/17   Rachael Fee, MD  ?                                                                                                                                  ?Past Surgical History ?Past Surgical History:  ?Procedure Laterality Date  ? ABDOMINAL HYSTERECTOMY    ? ADENOIDECTOMY    ? LAPAROSCOPIC GASTRIC BANDING N/A 12/03/2014  ? Procedure: LAPAROSCOPIC GASTRIC BANDING;  Surgeon: Luretha Murphy, MD;  Location: [facility] ORS;  Service: General;  Laterality: N/A;  ? MYRINGOTOMY    ? NO PAST SURGERIES    ? tube in ears    ? ?Family History ?Family History  ?Problem Relation Age of Onset  ? Hypertension Father   ? Kidney Stones Father   ? Migraines Father   ?  Depression Father   ? Muscular dystrophy Father   ? Heart disease Maternal Grandmother   ? Hypertension Maternal Grandmother   ? Cancer Maternal Grandmother   ?     kidney  ? Depression Maternal Grandmother   ? Heart disease Maternal Grandfather   ? Hypertension Maternal Grandfather   ? Depression Maternal Grandfather   ? Heart disease Paternal Grandmother   ? Hypertension Paternal Grandmother   ? Depression Paternal Grandmother   ? Muscular dystrophy Paternal Grandmother   ? Heart disease Paternal Grandfather   ? Hypertension Paternal Grandfather   ? Cancer Paternal Grandfather   ?     colon, leaukemia  ? Colon cancer Paternal Grandfather   ? Other Neg Hx   ? Esophageal cancer Neg Hx   ? Rectal cancer Neg Hx   ? Stomach cancer Neg Hx   ? ? ?Social History ?Social History  ? ?Tobacco Use  ? Smoking status: Never  ? Smokeless tobacco: Never  ?Vaping Use  ? Vaping Use: Never used  ?Substance Use Topics  ? Alcohol use: No  ? Drug use: No  ? ?Allergies ?Other, Chlorhexidine gluconate,  and Hydromorphone ? ?Review of Systems ?Review of Systems  ?Respiratory:  Positive for shortness of breath.   ?Cardiovascular:  Positive for chest pain and palpitations.  ? ?Physical Exam ?Vital Signs  ?I have reviewed the triage vital signs ?BP 125/77   Pulse (!) 108   Temp 98.2 ?F (36.8 ?C) (Oral)   Resp (!) 23   Ht 5\' 4"  (1.626 m)   Wt 108.9 kg   LMP 08/18/2013   SpO2 97%   BMI 41.20 kg/m?  ? ?Physical Exam ?Vitals and nursing note reviewed.  ?Constitutional:   ?   General: She is not in acute distress. ?   Appearance: She is well-developed.  ?HENT:  ?   Head: Normocephalic and atraumatic.  ?Eyes:  ?   Conjunctiva/sclera: Conjunctivae normal.  ?Cardiovascular:  ?   Rate and Rhythm: Normal rate and regular rhythm.  ?   Heart sounds: No murmur heard. ?Pulmonary:  ?   Effort: Pulmonary effort is normal. No respiratory distress.  ?   Breath sounds: Normal breath sounds.  ?Abdominal:  ?   Palpations: Abdomen is soft.  ?   Tenderness: There is no abdominal tenderness.  ?Musculoskeletal:     ?   General: No swelling.  ?   Cervical back: Neck supple.  ?Skin: ?   General: Skin is warm and dry.  ?   Capillary Refill: Capillary refill takes less than 2 seconds.  ?Neurological:  ?   Mental Status: She is alert.  ?Psychiatric:     ?   Mood and Affect: Mood normal.  ? ? ?ED Results and Treatments ?Labs ?(all labs ordered are listed, but only abnormal results are displayed) ?Labs Reviewed  ?BASIC METABOLIC PANEL  ?CBC  ?DIFFERENTIAL  ?TSH  ?D-DIMER, QUANTITATIVE  ?TROPONIN I (HIGH SENSITIVITY)  ?                                                                                                                       ? ?  Radiology ?DG Chest 2 View ? ?Result Date: 07/09/2021 ?CLINICAL DATA:  Shortness of breath and chest pain EXAM: CHEST - 2 VIEW COMPARISON:  06/14/2014 FINDINGS: Lateral view degraded by patient arm position. Numerous leads and wires project over the chest. Midline trachea.  Normal heart size and mediastinal  contours. Sharp costophrenic angles.  No pneumothorax.  Clear lungs. IMPRESSION: No active cardiopulmonary disease. Electronically Signed   By: Jeronimo Greaves M.D.   On: 07/09/2021 15:42  ? ?CT CHEST ABDOMEN PELVIS W CONTRAST ? ?Result Date: 07/09/2021 ?CLINICAL DATA:  Palpitation. EXAM: CT CHEST, ABDOMEN, AND PELVIS WITH CONTRAST TECHNIQUE: Multidetector CT imaging of the chest, abdomen and pelvis was performed following the standard protocol during bolus administration of intravenous contrast. RADIATION DOSE REDUCTION: This exam was performed according to the departmental dose-optimization program which includes automated exposure control, adjustment of the mA and/or kV according to patient size and/or use of iterative reconstruction technique. CONTRAST:  OMNIPAQUE IOHEXOL 300 MG/ML  SOLN COMPARISON:  Chest and abdominal radiograph dated 07/09/2021. FINDINGS: CT CHEST FINDINGS Cardiovascular: There is no cardiomegaly or pericardial effusion. The thoracic aorta is unremarkable. The origins of the great vessels of the aortic arch appear patent as visualized. The central pulmonary arteries are unremarkable. Mediastinum/Nodes: No hilar or mediastinal adenopathy. The esophagus and the thyroid gland are grossly unremarkable. No mediastinal fluid collection. Lungs/Pleura: The lungs are clear. There is no pleural effusion or pneumothorax. The central airways are patent. Musculoskeletal: No chest wall mass or suspicious bone lesions identified. CT ABDOMEN PELVIS FINDINGS No intra-abdominal free air.  Trace free fluid within the pelvis. Hepatobiliary: No focal liver abnormality is seen. No gallstones, gallbladder wall thickening, or biliary dilatation. Pancreas: Unremarkable. No pancreatic ductal dilatation or surrounding inflammatory changes. Spleen: Normal in size without focal abnormality. Adrenals/Urinary Tract: Adrenal glands are unremarkable. Kidneys are normal, without renal calculi, focal lesion, or  hydronephrosis. Bladder is unremarkable. Stomach/Bowel: A gastric lap band is noted. Several small scattered sigmoid diverticula without active inflammatory changes. There is no bowel obstruction or active inflammation. The appendix is

## 2021-07-09 NOTE — ED Triage Notes (Signed)
Patient reports lap band fill about 2 weeks ago, the last 3 days pt has been unable to catch ehr breath and feels like she is having heart palpitations. Got the lap band removed today, no improvement in her symptoms. Pain only when she eats or drinks, makes her SOB and she cannot lay down.  ?

## 2021-08-05 NOTE — Patient Instructions (Addendum)
DUE TO COVID-19 ONLY ONE VISITOR  (aged 36 and older)  IS ALLOWED TO COME WITH YOU AND STAY IN THE WAITING ROOM ONLY DURING PRE OP AND PROCEDURE.   ?**NO VISITORS ARE ALLOWED IN THE SHORT STAY AREA OR RECOVERY ROOM!!** ? ?IF YOU WILL BE ADMITTED INTO THE HOSPITAL YOU ARE ALLOWED ONLY TWO SUPPORT PEOPLE DURING VISITATION HOURS ONLY (7 AM -8PM)   ?The support person(s) must pass our screening, gel in and out, and wear a mask at all times, including in the patient?s room. ?Patients must also wear a mask when staff or their support person are in the room. ?Visitors GUEST BADGE MUST BE WORN VISIBLY  ?One adult visitor may remain with you overnight and MUST be in the room by 8 P.M. ?  ? ? Your procedure is scheduled on: 09/14/21 ? ? Report to [facility] Main Entrance ? ?  Report to Short stay at : 5:15 AM ? ? Call this number if you have problems the morning of surgery [phone] ? ? Do not eat food :After Midnight. ? ? After Midnight you may have the following liquids until : 4:00 AM DAY OF SURGERY ? ?Water ?Black Coffee (sugar ok, NO MILK/CREAM OR CREAMERS)  ?Tea (sugar ok, NO MILK/CREAM OR CREAMERS) regular and decaf                             ?Plain Jell-O (NO RED)                                           ?Fruit ices (not with fruit pulp, NO RED)                                     ?Popsicles (NO RED)                                                                  ?Juice: apple, WHITE grape, WHITE cranberry ?Sports drinks like Gatorade (NO RED) ?Clear broth(vegetable,chicken,beef) ?     ?FOLLOW BOWEL PREP AND ANY ADDITIONAL PRE OP INSTRUCTIONS YOU RECEIVED FROM YOUR SURGEON'S OFFICE!!! ?  ?Oral Hygiene is also important to reduce your risk of infection.                                    ?Remember - BRUSH YOUR TEETH THE MORNING OF SURGERY WITH YOUR REGULAR TOOTHPASTE ? ? Do NOT smoke after Midnight ? ? Take these medicines the morning of surgery with A SIP OF WATER: cetirizine. ? ?DO NOT TAKE ANY ORAL  DIABETIC MEDICATIONS DAY OF YOUR SURGERY ? ?Bring CPAP mask and tubing day of surgery. ?                  ?           You may not have any metal on your body including hair pins, jewelry, and body piercing ? ?  Do not wear make-up, lotions, powders, perfumes/cologne, or deodorant ? ?Do not wear nail polish including gel and S&S, artificial/acrylic nails, or any other type of covering on natural nails including finger and toenails. If you have artificial nails, gel coating, etc. that needs to be removed by a nail salon please have this removed prior to surgery or surgery may need to be canceled/ delayed if the surgeon/ anesthesia feels like they are unable to be safely monitored.  ? ?Do not shave  48 hours prior to surgery.  ? ? Do not bring valuables to the hospital. [facility] NOT ?            RESPONSIBLE   FOR VALUABLES. ? ? Contacts, dentures or bridgework may not be worn into surgery. ? ? Bring small overnight bag day of surgery. ?  ? Patients discharged on the day of surgery will not be allowed to drive home.  Someone NEEDS to stay with you for the first 24 hours after anesthesia. ? ? Special Instructions: Bring a copy of your healthcare power of attorney and living will documents         the day of surgery if you haven't scanned them before. ? ?            Please read over the following fact sheets you were given: IF [place] [phone] ? ?   [place] - Preparing for Surgery ?Before surgery, you can play an important role.  Because skin is not sterile, your skin needs to be as free of germs as possible.  You can reduce the number of germs on your skin by washing with CHG (chlorahexidine gluconate) soap before surgery.  CHG is an antiseptic cleaner which kills germs and bonds with the skin to continue killing germs even after washing. ?Please DO NOT use if you have an allergy to CHG or antibacterial soaps.  If your skin becomes  reddened/irritated stop using the CHG and inform your nurse when you arrive at Short Stay. ?Do not shave (including legs and underarms) for at least 48 hours prior to the first CHG shower.  You may shave your face/neck. ?Please follow these instructions carefully: ? 1.  Shower with CHG Soap the night before surgery and the  morning of Surgery. ? 2.  If you choose to wash your hair, wash your hair first as usual with your  normal  shampoo. ? 3.  After you shampoo, rinse your hair and body thoroughly to remove the  shampoo.                           4.  Use CHG as you would any other liquid soap.  You can apply chg directly  to the skin and wash  ?                     Gently with a scrungie or clean washcloth. ? 5.  Apply the CHG Soap to your body ONLY FROM THE NECK DOWN.   Do not use on face/ open      ?                     Wound or open sores. Avoid contact with eyes, ears mouth and genitals (private parts).  ?  Wash face,  Genitals (private parts) with your normal soap. ?            6.  Wash thoroughly, paying special attention to the area where your surgery  will be performed. ? 7.  Thoroughly rinse your body with warm water from the neck down. ? 8.  DO NOT shower/wash with your normal soap after using and rinsing off  the CHG Soap. ?               9.  Pat yourself dry with a clean towel. ?           10.  Wear clean pajamas. ?           11.  Place clean sheets on your bed the night of your first shower and do not  sleep with pets. ?Day of Surgery : ?Do not apply any lotions/deodorants the morning of surgery.  Please wear clean clothes to the hospital/surgery center. ? ?FAILURE TO FOLLOW THESE INSTRUCTIONS MAY RESULT IN THE CANCELLATION OF YOUR SURGERY ?PATIENT SIGNATURE_________________________________ ? ?NURSE SIGNATURE__________________________________ ? ?________________________________________________________________________  ?

## 2021-08-25 NOTE — Progress Notes (Signed)
Sent message, via epic in basket, requesting orders in epic from surgeon.  

## 2021-08-31 ENCOUNTER — Other Ambulatory Visit: Payer: Self-pay

## 2021-08-31 ENCOUNTER — Encounter (HOSPITAL_COMMUNITY)
Admission: RE | Admit: 2021-08-31 | Discharge: 2021-08-31 | Disposition: A | Discharge status: Home / Self Care | Payer: No Typology Code available for payment source | Source: Ambulatory Visit | Attending: Surgery | Admitting: Surgery

## 2021-08-31 ENCOUNTER — Encounter (HOSPITAL_COMMUNITY): Payer: Self-pay

## 2021-08-31 VITALS — BP 139/79 | HR 102 | Temp 98.4°F | Ht 65.0 in | Wt 240.0 lb

## 2021-08-31 DIAGNOSIS — Z01818 Encounter for other preprocedural examination: Secondary | ICD-10-CM

## 2021-08-31 DIAGNOSIS — Z01812 Encounter for preprocedural laboratory examination: Secondary | ICD-10-CM | POA: Diagnosis present

## 2021-08-31 LAB — CBC
HCT: 39.3 % (ref 36.0–46.0)
Hemoglobin: 13.1 g/dL (ref 12.0–15.0)
MCH: 28.7 pg (ref 26.0–34.0)
MCHC: 33.3 g/dL (ref 30.0–36.0)
MCV: 86 fL (ref 80.0–100.0)
Platelets: 217 10*3/uL (ref 150–400)
RBC: 4.57 MIL/uL (ref 3.87–5.11)
RDW: 13 % (ref 11.5–15.5)
WBC: 6.8 10*3/uL (ref 4.0–10.5)
nRBC: 0 % (ref 0.0–0.2)

## 2021-08-31 NOTE — Progress Notes (Signed)
For Short Stay: ?[place] appointment date: N/A ?Date of COVID positive in last 10 days:N/A ? ?Bowel Prep reminder: ? ? ?For Anesthesia: ?PCP - Dr. Mayra Neer ?Cardiologist -  ? ?Chest x-ray -  ?EKG - 07/09/21 ?Stress Test -  ?ECHO -  ?Cardiac Cath -  ?Pacemaker/ICD device last checked: ?Pacemaker orders received: ?Device Rep notified: ? ?Spinal Cord Stimulator: ? ?Sleep Study -  ?CPAP -  ? ?Fasting Blood Sugar -  ?Checks Blood Sugar _____ times a day ?Date and result of last Hgb A1c- ? ?Blood Thinner Instructions: ?Aspirin Instructions: ?Last Dose: ? ?Activity level: Can go up a flight of stairs and activities of daily living without stopping and without chest pain and/or shortness of breath ?  Able to exercise without chest pain and/or shortness of breath ?  Unable to go up a flight of stairs without chest pain and/or shortness of breath ?   ? ?Anesthesia review:  ? ?Patient denies shortness of breath, fever, cough and chest pain at PAT appointment ? ? ?Patient verbalized understanding of instructions that were given to them at the PAT appointment. Patient was also instructed that they will need to review over the PAT instructions again at home before surgery.  ?

## 2021-09-14 ENCOUNTER — Encounter (HOSPITAL_COMMUNITY): Admission: RE | Payer: Self-pay | Source: Ambulatory Visit

## 2021-09-14 ENCOUNTER — Ambulatory Visit (HOSPITAL_COMMUNITY)
Admission: RE | Admit: 2021-09-14 | Payer: No Typology Code available for payment source | Source: Ambulatory Visit | Admitting: Surgery

## 2021-09-14 SURGERY — REMOVAL, GASTRIC BAND, LAPAROSCOPIC
Anesthesia: General

## 2021-09-25 ENCOUNTER — Other Ambulatory Visit: Payer: Self-pay

## 2021-09-25 ENCOUNTER — Other Ambulatory Visit: Payer: Self-pay | Admitting: Surgery

## 2021-09-25 DIAGNOSIS — Z9884 Bariatric surgery status: Secondary | ICD-10-CM

## 2021-09-25 DIAGNOSIS — R12 Heartburn: Secondary | ICD-10-CM

## 2021-09-25 DIAGNOSIS — K219 Gastro-esophageal reflux disease without esophagitis: Secondary | ICD-10-CM

## 2021-09-25 DIAGNOSIS — K311 Adult hypertrophic pyloric stenosis: Secondary | ICD-10-CM

## 2021-10-02 ENCOUNTER — Other Ambulatory Visit: Payer: Self-pay | Admitting: Surgery

## 2021-10-02 ENCOUNTER — Ambulatory Visit
Admission: RE | Admit: 2021-10-02 | Discharge: 2021-10-02 | Disposition: A | Discharge status: Home / Self Care | Payer: No Typology Code available for payment source | Source: Ambulatory Visit | Attending: Surgery | Admitting: Surgery

## 2021-10-02 DIAGNOSIS — K219 Gastro-esophageal reflux disease without esophagitis: Secondary | ICD-10-CM

## 2021-10-02 DIAGNOSIS — Z9884 Bariatric surgery status: Secondary | ICD-10-CM

## 2021-10-02 DIAGNOSIS — R12 Heartburn: Secondary | ICD-10-CM

## 2021-10-02 DIAGNOSIS — K311 Adult hypertrophic pyloric stenosis: Secondary | ICD-10-CM

## 2023-02-08 NOTE — Progress Notes (Signed)
 [facility]       HEART FAILURE/CARDIOMYOPATHY CLINIC      PATIENT:  Kelsey Gonzales DOB: 1985/09/01              MRN:  [id number]                                                   PCP:  No primary care provider on file.  PRIMARY CARDIOLOGIST:  EP:  HF: Lavetta Dustman, NP/Dr. Barbara    DATE OF VISIT:  02/08/2023                                            REASON FOR VISIT:  Palpitations/Screening for Cardiomyopathy   HISTORY OF PRESENT ILLNESS:  Kelsey Gonzales is a 37 y.o. female who is seen today for evaluation of palpitations and for screening for cardiomyopathy.   PROBLEM LIST Palpitations Migraine PCOS Elevated ANA Anxiety 5. S/P Gastric Banding/ removal  Kelsey Gonzales is a 37 y.o. female with medical hx significant for the above.   Patient is accompanied by Husband, Kelsey Gonzales.  She presents today for cardiac screening for cardiomyopathy. Her daughter has recently been diagnosed with dilated cardiomyopathy after experiencing chest pain with exertion and syncope. 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 While she continues her evaluation at [facility],her mother presents for screening and workup of palpitations.  States she has been bothered with palpitations for the last year . 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 She was seen in the ED at [facility] in 2023 with palpitations and lab workup was negative. She denies CP, no exertional dyspnea. States she becomes winded at times but feels it is due to  being out of shape. She describes a full sensation with the palpitations and feels like blood is rushing to her head. She denies syncope or presyncope. Palpitations increase with laying down. She has a history of migraines and takes Nurtec as needed. No history of heart murmur in the  past. She denies family history of CAD although Father has an enlarged heart, which she describes as LVH and he has hypertension. No SCD. ROS negative for dyspnea, lower extremity edema, orthopnea/PND, abdominal fullness/bloating, early satiety, significant unexplained weight gain, LH/dizziness, near syncope or syncope.    Reports the following cardiac symptoms: Palpitations    BP Readings from Last 3 Encounters:  02/08/23 118/72    Wt Readings from Last 3 Encounters:  02/08/23 248 lb 3.2 oz (112.6 kg)     REVIEW OF SYSTEMS:  General: No fever.  HEENT: No hearing loss. No vision loss.  Neuro: No seizures. No syncope. No headache. No motor or sensory symptoms. GI: No abdominal pain. No hematemesis. No melena. No hematochezia.  No nausea. No vomiting. No constipation. No diarrhea.  Vascular: No claudication Endocrine: No heat or cold intolerance. No polyuria or polydypsia. No hair loss. Skin: No rash or suspicious lesions. MS: No muscle weakness. No myalgia. No arthralgia. No joint swelling. No back pain. GU: No dysuria. No hematuria. Hem: No purpura. No easy bruising. No enlarged lymph nodes.   _________________________________________________________________________________________________________________________________________________________  Previous Outpatient Visit Notes: None   PAST MEDICAL HISTORY: No past medical history on file.   PAST SURGICAL HISTORY: No past surgical history on file.   ALLERGIES: Allergies  Allergen Reactions  . Hydromorphone Nausea Only and Nausea And Vomiting     SOCIAL HISTORY:   Social History   Tobacco Use  . Smoking status: Never    Passive exposure: Never  . Smokeless tobacco: Never  Vaping Use  . Vaping Use: Never used  Substance Use Topics  . Drug use: Never     FAMILY HISTORY: No family history on file.   CURRENT MEDICATIONS: Current Outpatient Medications  Medication Instructions  . magnesium gluconate  30 mg, Oral, Daily  . Rimegepant Sulfate (NURTEC) 75 MG TBDP As needed     PHYSICAL EXAMINATION:   BP 118/72   Pulse 101   Ht 5' 5 (1.651 m)   Wt 248 lb 3.2 oz (112.6 kg)   SpO2 98%   Breastfeeding No   BMI 41.30 kg/m  General: No apparent distress. Communicating appropriately.   Psych: Normal mood and affect.  HENT: Normocephalic. No acute abnormalities appreciated.  Cardiovascular:  Normal S1. Normal S2. No S3. No S4. No murmurs. Regular rhythm. No lower extremity edema. JVP: flat  Pulmonary/Chest: CTA bilaterally. No rales. No wheezing.  Abdomen: Soft, nontender, no masses, no hepatomegaly. Neuro: No gross motor or sensory deficit. Skin: Warm.   Vascular: Normal distal pulses bilaterally.   WEIGHT TREND: Wt Readings from Last 5 Encounters:  02/08/23 248 lb 3.2 oz (112.6 kg)    BP Readings from Last 3 Encounters:  02/08/23 118/72     DIAGNOSTICS:  LABS:   No results found for: BNP     Chemistry   No results found for: NA, K, CL, CO2, BUN, CREATININE, GLUCOSE No results found for: CALCIUM, ALKPHOS, AST, ALT, BILITOT      EKG: No results found for this or any previous visit.   ECHOCARDIOGRAPHY:      RIGHT HEART HEMODYNAMICS:   6-MWT DATA:      ASSESSMENT:  - Palpitations   - Co-Morbid Conditions: > HTN: Controlled BP Readings from Last 3 Encounters:  02/08/23 118/72    -Cardiomyopathy Screening -Daughter's genetic testing result may allow family members to be tested free given VUS. Discuss with Pediatric Cardiologist.   PLAN:   Today's Assessment/Recommendations: Seen today as self referral  On exam, patient appears compensated from a heart failure standpoint. Chief complaint today is: palpitations PLAN: Testing ordered: 7-14 day holter monitor placed, echocardiogram with bubble study given migraine Referrals Placed: Continue all other medications as prescribed Follow up:  1 month   - Arrange  for: - Echocardiography  No future appointments.  Orders Placed This Encounter  Procedures  . Long Term Holter (3-7 Days) Interp Only  . Long Term Holter (3-7 Days) Recording Only  . ECG 12 lead  . Echocardiogram Complete WO Enhancing Agent    Documentation for time-based billing:  Total time spent of date of service was 40 minutes.  Patient care activities included preparing to see the patient such as reviewing the patient record, obtaining and/or reviewing separately obtained history, performing a medically appropriate history and physical examination, and counseling and educating the patient, family, and/or caregiver.   Heart Failure/Cardiomyopathy Clinic  [place] LITTIE Dustman, NP

## 2024-02-09 ENCOUNTER — Ambulatory Visit
Admission: RE | Admit: 2024-02-09 | Discharge: 2024-02-09 | Disposition: A | Discharge status: Home / Self Care | Source: Ambulatory Visit

## 2024-02-09 VITALS — BP 117/71 | HR 101 | Temp 97.8°F | Resp 16

## 2024-02-09 DIAGNOSIS — R197 Diarrhea, unspecified: Secondary | ICD-10-CM

## 2024-02-09 NOTE — ED Triage Notes (Addendum)
 Pt states last Friday she developed explosive diarrhea and since then she has diarrhea after eating. Denies any nausea or vomiting.  She did have e-visit two days ago and they gave her prescription imodium which she hasn't taken yet.   States she got a new puppy last week and it has had diarrhea too

## 2024-02-09 NOTE — Discharge Instructions (Addendum)
  1. Intractable diarrhea (Primary) - Gastrointestinal Panel by PCR , Stool; Future - Stool culture; Future - Please collect stool specimen and stool collection kit and return specimen to clinic for delivery to lab for further testing - After collecting stool specimen you may begin taking previously prescribed Imodium to decrease frequency of diarrhea.  Take medication as directed and discontinue use if you have any adverse side effects. -Continue to monitor symptoms for any change in severity if there is any escalation of current symptoms or development of new symptoms follow-up in ER for further evaluation and management.

## 2024-02-09 NOTE — ED Provider Notes (Signed)
 [facility]  Note:  This document was prepared using Dragon voice recognition software and may include unintentional dictation errors.  MRN: [id number] DOB: April 14, 1986  Subjective:   Kelsey Gonzales is a 38 y.o. female presenting for evaluation of severe intractable diarrhea since last Friday.  Patient reports that she has diarrhea after eating every time within an hour of eating any food.  Patient reports she is able to keep fluids down without increasing diarrhea however she is afraid that she is not able to drink enough fluid to replace what she is losing through diarrhea.  Patient denies any nausea/vomiting, flank pain, dysuria, or back pain.  Patient denies any fever, shortness of breath, chest pain, weakness, dizziness.  Patient had an ED visit and was given a prescription for Imodium but she has not taken medication yet because she was concerned for possible infectious cause of her diarrhea symptoms.  Patient notes that she did get a new puppy last week and the pet puppy also has diarrhea and was concern for possible contamination from the dog.  No current facility-administered medications for this encounter.  Current Outpatient Medications:    COD LIVER OIL PO, Take 1 capsule by mouth daily., Disp: , Rfl:    linaclotide  (LINZESS ) 145 MCG CAPS capsule, Take 1 capsule (145 mcg total) by mouth daily before breakfast. 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 WITH  BLOOD COMPONENTS   Chlorhexidine  Gluconate Dermatitis    Broke out in rash post use of product   Hydromorphone Nausea And Vomiting    Past Medical History:  Diagnosis Date   Abnormal Pap smear    Elevated antinuclear antibody (ANA) level 10/01/2013   Hx of varicella    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   Polycystic ovarian syndrome    PONV (postoperative nausea and vomiting)      Past Surgical History:  Procedure Laterality Date   ABDOMINAL HYSTERECTOMY     ADENOIDECTOMY     LAPAROSCOPIC GASTRIC BANDING N/A 12/03/2014   Procedure: LAPAROSCOPIC GASTRIC BANDING;  Surgeon: Donnice Lunger, MD;  Location: [facility];  Service: General;  Laterality: N/A;   MYRINGOTOMY     tube in ears      Family History  Problem Relation Age of Onset   Hypertension Father    Kidney Stones Father    Migraines Father    Depression Father    Muscular dystrophy Father    Heart disease Maternal Grandmother    Hypertension Maternal Grandmother    Cancer Maternal Grandmother        kidney   Depression Maternal Grandmother    Heart disease Maternal Grandfather    Hypertension Maternal Grandfather    Depression Maternal Grandfather    Heart disease Paternal Grandmother    Hypertension Paternal Grandmother    Depression Paternal Grandmother    Muscular dystrophy Paternal Grandmother    Heart disease Paternal Grandfather  Hypertension Paternal Grandfather    Cancer Paternal Grandfather        colon, leaukemia   Colon cancer Paternal Grandfather    Other Neg Hx    Esophageal cancer Neg Hx    Rectal cancer Neg Hx    Stomach cancer Neg Hx     Social History   Tobacco Use   Smoking status: Never   Smokeless tobacco: Never  Vaping Use   Vaping status: Never Used  Substance Use Topics   Alcohol use: No   Drug use: No    ROS Refer to HPI for ROS details.  Objective:   Vitals: BP 117/71 (BP Location: Right Arm)   Pulse (!) 101   Temp 97.8 F (36.6 C)  (Oral)   Resp 16   LMP 08/18/2013   SpO2 98%   Physical Exam Vitals and nursing note reviewed.  Constitutional:      General: She is not in acute distress.    Appearance: Normal appearance. She is well-developed. She is not ill-appearing or toxic-appearing.  HENT:     Head: Normocephalic and atraumatic.     Mouth/Throat:     Mouth: Mucous membranes are moist.  Cardiovascular:     Rate and Rhythm: Normal rate.  Pulmonary:     Effort: Pulmonary effort is normal. No respiratory distress.  Abdominal:     Tenderness: There is abdominal tenderness (Severe abdominal cramping with diarrhea.). There is no right CVA tenderness or left CVA tenderness.  Skin:    General: Skin is warm and dry.  Neurological:     General: No focal deficit present.     Mental Status: She is alert and oriented to person, place, and time.  Psychiatric:        Mood and Affect: Mood normal.        Behavior: Behavior normal.     Procedures  No results found for this or any previous visit (from the past 24 hours).  No results found.   Assessment and Plan :     Discharge Instructions       1. Intractable diarrhea (Primary) - Gastrointestinal Panel by PCR , Stool; Future - Stool culture; Future - Please collect stool specimen and stool collection kit and return specimen to clinic for delivery to lab for further testing - After collecting stool specimen you may begin taking previously prescribed Imodium to decrease frequency of diarrhea.  Take medication as directed and discontinue use if you have any adverse side effects. -Continue to monitor symptoms for any change in severity if there is any escalation of current symptoms or development of new symptoms follow-up in ER for further evaluation and management.      Lajoya Dombek B Mandell Pangborn   Miron Marxen, [place] B, NP 02/09/24 1049

## 2024-02-13 ENCOUNTER — Telehealth: Payer: Self-pay | Admitting: Emergency Medicine

## 2024-02-13 ENCOUNTER — Other Ambulatory Visit (HOSPITAL_COMMUNITY)
Admission: RE | Admit: 2024-02-13 | Discharge: 2024-02-13 | Disposition: A | Discharge status: Home / Self Care | Source: Ambulatory Visit | Attending: Family Medicine | Admitting: Family Medicine

## 2024-02-13 DIAGNOSIS — R197 Diarrhea, unspecified: Secondary | ICD-10-CM | POA: Diagnosis present

## 2024-02-13 NOTE — Telephone Encounter (Signed)
Disregard telephone encounter.

## 2024-02-13 NOTE — ED Triage Notes (Signed)
 Patient called back  today on 10/20 to give another stool sample. Original sample submitted on 10/16. Orders not released, on 10/16 so test not run. 72 hour shelf life for stool so sample stored. Updated order on 10/20. Patient did not return by end of shift 10/20. Spoke with patients husband states will bring sample on 10/21  [place]  [facility] techs contacted  Tomador  Alex

## 2024-02-14 LAB — GASTROINTESTINAL PANEL BY PCR, STOOL (REPLACES STOOL CULTURE)

## 2024-02-15 ENCOUNTER — Ambulatory Visit (HOSPITAL_COMMUNITY): Payer: Self-pay
